# Patient Record
Sex: Male | Born: 1985 | Race: White | Hispanic: No | Marital: Single | Smoking: Current every day smoker
Health system: Southern US, Community
[De-identification: ages and names within clinical notes are randomized; demographics above are authoritative.]

## PROBLEM LIST (undated history)

## (undated) DIAGNOSIS — F1410 Cocaine abuse, uncomplicated: Secondary | ICD-10-CM

## (undated) DIAGNOSIS — S064XAA Epidural hemorrhage with loss of consciousness status unknown, initial encounter: Secondary | ICD-10-CM

## (undated) DIAGNOSIS — R569 Unspecified convulsions: Secondary | ICD-10-CM

## (undated) DIAGNOSIS — S064X9A Epidural hemorrhage with loss of consciousness of unspecified duration, initial encounter: Secondary | ICD-10-CM

## (undated) DIAGNOSIS — F1110 Opioid abuse, uncomplicated: Secondary | ICD-10-CM

## (undated) DIAGNOSIS — F1010 Alcohol abuse, uncomplicated: Secondary | ICD-10-CM

## (undated) HISTORY — PX: DENTAL SURGERY: SHX609

---

## 2010-10-12 ENCOUNTER — Emergency Department (HOSPITAL_BASED_OUTPATIENT_CLINIC_OR_DEPARTMENT_OTHER)
Admission: EM | Admit: 2010-10-12 | Discharge: 2010-10-12 | Payer: Self-pay | Source: Home / Self Care | Admitting: Emergency Medicine

## 2010-10-15 LAB — URINALYSIS, ROUTINE W REFLEX MICROSCOPIC
Bilirubin Urine: NEGATIVE
Ketones, ur: NEGATIVE mg/dL
Protein, ur: NEGATIVE mg/dL
Urine Glucose, Fasting: NEGATIVE mg/dL

## 2010-10-15 LAB — LACTIC ACID, PLASMA: Lactic Acid, Venous: 1.6 mmol/L (ref 0.5–2.2)

## 2010-10-15 LAB — COMPREHENSIVE METABOLIC PANEL
ALT: 18 U/L (ref 0–53)
Albumin: 5 g/dL (ref 3.5–5.2)
Alkaline Phosphatase: 125 U/L — ABNORMAL HIGH (ref 39–117)
GFR calc Af Amer: >60 mL/min (ref >60)
Potassium: 3.7 mEq/L (ref 3.5–5.1)
Sodium: 145 mEq/L (ref 135–145)
Total Protein: 8.1 g/dL (ref 6.0–8.3)

## 2010-10-15 LAB — DIFFERENTIAL
Basophils Relative: 0 % (ref 0–1)
Eosinophils Absolute: 0.1 10*3/uL (ref 0.0–0.7)
Monocytes Absolute: 0.6 10*3/uL (ref 0.1–1.0)
Monocytes Relative: 5 % (ref 3–12)
Neutro Abs: 9.4 10*3/uL — ABNORMAL HIGH (ref 1.7–7.7)

## 2010-10-15 LAB — POCT TOXICOLOGY PANEL
Cocaine: POSITIVE
Tetrahydrocannabinol: POSITIVE

## 2010-10-15 LAB — CBC
Hemoglobin: 16.1 g/dL (ref 13.0–17.0)
MCH: 30.3 pg (ref 26.0–34.0)
MCHC: 35.7 g/dL (ref 30.0–36.0)

## 2011-01-27 ENCOUNTER — Emergency Department (HOSPITAL_COMMUNITY): Payer: No Typology Code available for payment source

## 2011-01-27 ENCOUNTER — Emergency Department (HOSPITAL_COMMUNITY)
Admission: EM | Admit: 2011-01-27 | Discharge: 2011-01-27 | Disposition: A | Discharge status: Home / Self Care | Payer: No Typology Code available for payment source | Attending: Emergency Medicine | Admitting: Emergency Medicine

## 2011-01-27 DIAGNOSIS — S301XXA Contusion of abdominal wall, initial encounter: Secondary | ICD-10-CM | POA: Insufficient documentation

## 2011-01-27 DIAGNOSIS — R109 Unspecified abdominal pain: Secondary | ICD-10-CM | POA: Insufficient documentation

## 2011-01-27 DIAGNOSIS — IMO0002 Reserved for concepts with insufficient information to code with codable children: Secondary | ICD-10-CM | POA: Insufficient documentation

## 2011-01-27 LAB — COMPREHENSIVE METABOLIC PANEL
AST: 22 U/L (ref 0–37)
Albumin: 4.4 g/dL (ref 3.5–5.2)
Alkaline Phosphatase: 120 U/L — ABNORMAL HIGH (ref 39–117)
Chloride: 103 mEq/L (ref 96–112)
Creatinine, Ser: 0.67 mg/dL (ref 0.4–1.5)
GFR calc Af Amer: >60 mL/min (ref >60)
Potassium: 3.4 mEq/L — ABNORMAL LOW (ref 3.5–5.1)
Total Bilirubin: 0.3 mg/dL (ref 0.3–1.2)
Total Protein: 7.2 g/dL (ref 6.0–8.3)

## 2011-01-27 LAB — CBC
HCT: 43.1 % (ref 39.0–52.0)
Hemoglobin: 15.3 g/dL (ref 13.0–17.0)
MCHC: 35.5 g/dL (ref 30.0–36.0)
MCV: 87.2 fL (ref 78.0–100.0)
RDW: 13.5 % (ref 11.5–15.5)

## 2011-01-27 LAB — URINALYSIS, ROUTINE W REFLEX MICROSCOPIC
Glucose, UA: NEGATIVE mg/dL
Ketones, ur: NEGATIVE mg/dL
Nitrite: NEGATIVE
Specific Gravity, Urine: 1.012 (ref 1.005–1.030)
pH: 6 (ref 5.0–8.0)

## 2011-01-27 LAB — POCT I-STAT, CHEM 8
BUN: 4 mg/dL — ABNORMAL LOW (ref 6–23)
Calcium, Ion: 1.01 mmol/L — ABNORMAL LOW (ref 1.12–1.32)
Chloride: 105 mEq/L (ref 96–112)
Creatinine, Ser: 1.5 mg/dL (ref 0.4–1.5)
Glucose, Bld: 88 mg/dL (ref 70–99)

## 2011-01-27 LAB — ABO/RH: ABO/RH(D): A POS

## 2011-01-27 LAB — ETHANOL: Alcohol, Ethyl (B): 255 mg/dL — ABNORMAL HIGH (ref 0–10)

## 2011-01-27 LAB — TYPE AND SCREEN

## 2011-01-27 MED ORDER — IOHEXOL 300 MG/ML  SOLN
100.0000 mL | Freq: Once | INTRAMUSCULAR | Status: AC | PRN
  Administered 2011-01-27: 100 mL via INTRAVENOUS

## 2013-02-18 ENCOUNTER — Emergency Department (HOSPITAL_COMMUNITY): Payer: Self-pay

## 2013-02-18 ENCOUNTER — Emergency Department (HOSPITAL_COMMUNITY)
Admission: EM | Admit: 2013-02-18 | Discharge: 2013-02-19 | Disposition: A | Discharge status: Home / Self Care | Payer: Self-pay | Attending: Emergency Medicine | Admitting: Emergency Medicine

## 2013-02-18 ENCOUNTER — Encounter (HOSPITAL_COMMUNITY): Payer: Self-pay

## 2013-02-18 DIAGNOSIS — R300 Dysuria: Secondary | ICD-10-CM | POA: Insufficient documentation

## 2013-02-18 DIAGNOSIS — R109 Unspecified abdominal pain: Secondary | ICD-10-CM | POA: Insufficient documentation

## 2013-02-18 DIAGNOSIS — F1410 Cocaine abuse, uncomplicated: Secondary | ICD-10-CM | POA: Insufficient documentation

## 2013-02-18 DIAGNOSIS — F1010 Alcohol abuse, uncomplicated: Secondary | ICD-10-CM | POA: Insufficient documentation

## 2013-02-18 DIAGNOSIS — F1210 Cannabis abuse, uncomplicated: Secondary | ICD-10-CM | POA: Insufficient documentation

## 2013-02-18 DIAGNOSIS — F1910 Other psychoactive substance abuse, uncomplicated: Secondary | ICD-10-CM

## 2013-02-18 DIAGNOSIS — Z79899 Other long term (current) drug therapy: Secondary | ICD-10-CM | POA: Insufficient documentation

## 2013-02-18 DIAGNOSIS — G40909 Epilepsy, unspecified, not intractable, without status epilepticus: Secondary | ICD-10-CM | POA: Insufficient documentation

## 2013-02-18 HISTORY — DX: Unspecified convulsions: R56.9

## 2013-02-18 LAB — COMPREHENSIVE METABOLIC PANEL
ALT: 48 U/L (ref 0–53)
AST: 64 U/L — ABNORMAL HIGH (ref 0–37)
Alkaline Phosphatase: 118 U/L — ABNORMAL HIGH (ref 39–117)
CO2: 29 mEq/L (ref 19–32)
Chloride: 101 mEq/L (ref 96–112)
GFR calc non Af Amer: >90 mL/min (ref >90)
Sodium: 144 mEq/L (ref 135–145)
Total Bilirubin: 0.2 mg/dL — ABNORMAL LOW (ref 0.3–1.2)

## 2013-02-18 LAB — CBC
Platelets: 346 10*3/uL (ref 150–400)
RBC: 4.58 MIL/uL (ref 4.22–5.81)
WBC: 5.5 10*3/uL (ref 4.0–10.5)

## 2013-02-18 LAB — RAPID URINE DRUG SCREEN, HOSP PERFORMED
Barbiturates: NOT DETECTED
Tetrahydrocannabinol: POSITIVE — AB

## 2013-02-18 LAB — VALPROIC ACID LEVEL: Valproic Acid Lvl: 10 ug/mL — ABNORMAL LOW (ref 50.0–100.0)

## 2013-02-18 MED ORDER — VITAMIN B-1 100 MG PO TABS
100.0000 mg | ORAL_TABLET | Freq: Every day | ORAL | Status: DC
  Administered 2013-02-19: 100 mg via ORAL
  Filled 2013-02-18: qty 1

## 2013-02-18 MED ORDER — THIAMINE HCL 100 MG/ML IJ SOLN: 100.0000 mg | Freq: Every day | INTRAMUSCULAR | Status: DC

## 2013-02-18 MED ORDER — LORAZEPAM 2 MG/ML IJ SOLN: 1.0000 mg | Freq: Four times a day (QID) | INTRAMUSCULAR | Status: DC | PRN

## 2013-02-18 MED ORDER — FOLIC ACID 1 MG PO TABS
1.0000 mg | ORAL_TABLET | Freq: Every day | ORAL | Status: DC
  Administered 2013-02-19: 1 mg via ORAL
  Filled 2013-02-18: qty 1

## 2013-02-18 MED ORDER — LORAZEPAM 1 MG PO TABS: 0.0000 mg | ORAL_TABLET | Freq: Two times a day (BID) | ORAL | Status: DC

## 2013-02-18 MED ORDER — DIVALPROEX SODIUM 500 MG PO DR TAB
500.0000 mg | DELAYED_RELEASE_TABLET | Freq: Two times a day (BID) | ORAL | Status: DC
  Administered 2013-02-19 (×2): 500 mg via ORAL
  Filled 2013-02-18 (×2): qty 1

## 2013-02-18 MED ORDER — IOHEXOL 300 MG/ML  SOLN
100.0000 mL | Freq: Once | INTRAMUSCULAR | Status: AC | PRN
  Administered 2013-02-18: 100 mL via INTRAVENOUS

## 2013-02-18 MED ORDER — LORAZEPAM 1 MG PO TABS
1.0000 mg | ORAL_TABLET | Freq: Four times a day (QID) | ORAL | Status: DC | PRN
  Filled 2013-02-18: qty 2

## 2013-02-18 MED ORDER — IOHEXOL 300 MG/ML  SOLN
50.0000 mL | Freq: Once | INTRAMUSCULAR | Status: AC | PRN
  Administered 2013-02-18: 50 mL via ORAL

## 2013-02-18 MED ORDER — LORAZEPAM 1 MG PO TABS
0.0000 mg | ORAL_TABLET | Freq: Four times a day (QID) | ORAL | Status: DC
  Administered 2013-02-19 (×2): 2 mg via ORAL
  Administered 2013-02-19: 1 mg via ORAL
  Filled 2013-02-18: qty 2
  Filled 2013-02-18: qty 1

## 2013-02-18 MED ORDER — ADULT MULTIVITAMIN W/MINERALS CH
1.0000 | ORAL_TABLET | Freq: Every day | ORAL | Status: DC
  Administered 2013-02-19: 1 via ORAL

## 2013-02-18 NOTE — ED Notes (Signed)
Pt states that his last seizure about two weeks ago

## 2013-02-18 NOTE — ED Notes (Signed)
CT aware that patient is finished with contrast.  

## 2013-02-18 NOTE — ED Provider Notes (Signed)
History  This chart was scribed for non-physician practitioner working with Jason Baker, MD by Jason Long, ED scribe. This patient was seen in room WTR6/WTR6 and the patient's care was started at 7:35 PM.   CSN: [id number]  Arrival date & time 02/18/13  1930    No chief complaint on file.   The history is provided by the patient. No language interpreter was used.    HPI Comments: Jason Long is a 27 y.o. male who presents to the Emergency Department stating he would like to be in detox from alcohol, cocaine, and marijuana. He states his last use was this afternoon. Pt states he was sober for about 18 months until mid February. He states he has been "partying" a lot recently. Pt states he has abdominal pain and dysuria. He states he has had 3 seizures in the last 6 weeks. Pt states he had a sleep deprivation EEG a few weeks ago and everything came back normal. Pt denies fever, neck pain, sore throat, CP, cough, SOB, nausea, emesis, diarrhea, HA, weakness, numbness and rash as associated symptoms.    No past medical history on file.  No past surgical history on file.  No family history on file.  History  Substance Use Topics  . Smoking status: Not on file  . Smokeless tobacco: Not on file  . Alcohol Use: Not on file      Review of Systems  A complete 10 system review of systems was obtained and all systems are negative except as noted in the HPI and PMH.   Allergies  Review of patient's allergies indicates no known allergies.  Home Medications   Current Outpatient Rx  Name  Route  Sig  Dispense  Refill  . divalproex (DEPAKOTE) 250 MG DR tablet   Oral   Take 500 mg by mouth 2 (two) times daily.           BP 146/81  Pulse 110  Temp(Src) 98.6 F (37 C) (Oral)  Resp 16  SpO2 94%  Physical Exam  Nursing note and vitals reviewed. Constitutional: He is oriented to person, place, and time. He appears well-developed and well-nourished. No distress.  HENT:   Head: Normocephalic and atraumatic.  Eyes: EOM are normal.  Neck: Neck supple. No tracheal deviation present.  Cardiovascular: Normal rate, regular rhythm and normal heart sounds.  Exam reveals no gallop and no friction rub.   No murmur heard. Pulmonary/Chest: Effort normal and breath sounds normal. No respiratory distress. He has no wheezes. He has no rales. He exhibits no tenderness.  Abdominal: He exhibits no distension and no mass. There is tenderness. There is no rebound and no guarding.  Diffuse abdominal tenderness, without rebounding or guarding. No focal tenderness. No fluid wave. No signs of acute or surgical abdomen.   Musculoskeletal: Normal range of motion.  Neurological: He is alert and oriented to person, place, and time.  Skin: Skin is warm and dry.  Psychiatric: He has a normal mood and affect. His behavior is normal.    ED Course  Procedures (including critical care time)  DIAGNOSTIC STUDIES: Oxygen Saturation is 94% on RA, adequate by my interpretation.    COORDINATION OF CARE: 9:10 PM-Discussed treatment plan with pt at bedside and pt agreed to plan.   Labs Reviewed  COMPREHENSIVE METABOLIC PANEL - Abnormal; Notable for the following:    Glucose, Bld 123 (*)    AST 64 (*)    Alkaline Phosphatase 118 (*)  Total Bilirubin 0.2 (*)    All other components within normal limits  ETHANOL - Abnormal; Notable for the following:    Alcohol, Ethyl (B) 323 (*)    All other components within normal limits  URINE RAPID DRUG SCREEN (HOSP PERFORMED) - Abnormal; Notable for the following:    Cocaine POSITIVE (*)    Tetrahydrocannabinol POSITIVE (*)    All other components within normal limits  CBC   No results found.   No diagnosis found.    MDM  Patient requesting detox.  He does have some abdominal tenderness.  Will check labs and ct.  Labs and imaging are normal.  Urine is still pending, otherwise medically clear.  Will move to the back and order psych  hold.  Discussed with ACT.      I personally performed the services described in this documentation, which was scribed in my presence. The recorded information has been reviewed and is accurate.    Jason Horseman, PA-C 02/19/13 [phone]

## 2013-02-18 NOTE — ED Notes (Signed)
Pt. And belongings searched and wanded by security. Pt. Placed in new blue scrubs.

## 2013-02-18 NOTE — ED Notes (Signed)
Pt here for detox from etoh, cocaine, and marijuana. Here voluntarily

## 2013-02-19 LAB — URINALYSIS, ROUTINE W REFLEX MICROSCOPIC
Hgb urine dipstick: NEGATIVE
Leukocytes, UA: NEGATIVE
Protein, ur: NEGATIVE mg/dL
Urobilinogen, UA: 0.2 mg/dL (ref 0.0–1.0)

## 2013-02-19 MED ORDER — NICOTINE 21 MG/24HR TD PT24
21.0000 mg | MEDICATED_PATCH | Freq: Every day | TRANSDERMAL | Status: DC
  Administered 2013-02-19: 21 mg via TRANSDERMAL
  Filled 2013-02-19: qty 1

## 2013-02-19 NOTE — Progress Notes (Signed)
[facility] has seen patient and provided him with a OC application. ° °

## 2013-02-19 NOTE — ED Provider Notes (Addendum)
Filed Vitals:   02/19/13 0512  BP: 139/79  Pulse: 95  Temp: 98.5 F (36.9 C)  Resp: 20   Sleeping this am.  Awaiting placement.  Celene Kras, MD 02/19/13 0745  10:36 AM Pt decided he did not want treatment.  Celene Kras, MD 02/19/13 [phone]

## 2013-02-19 NOTE — ED Notes (Signed)
Pt's CIWA is a 9. Pt did not received his Ativan at 0430 this morning and has a history of seizures. Last one was a week ago. Pt given 2 mg of Ativan and waited awhile. Tremors can still be seen with arms extended. Given the additional 2 mg to help pt with his detox.

## 2013-02-19 NOTE — ED Notes (Signed)
Pt transferred from triage, presents for detox from alcohol, cocaine & marijuana.  Pt reports he drinks a fifth/day.  States he is diag. With Bipolar, PTSD and Insomnia.  Pt irritable but cooperative. Smell of alcohol noted.

## 2013-02-19 NOTE — BH Assessment (Signed)
Assessment Note   Jason Long is an 27 y.o. male. Pt requests detox from alcohol .  Reports daily drinking of fifth of liquor for past 2 1/2 months.  Also using marijuana and cocaine.Reports 16 months of sobriety ended 11/2012 and "I need to get back on track."  Pt does report withdrawal symptoms and does appear to be withdrawing by elevated pulse.  Pt also reports 3 alcohol related siezures in 2014 with the most recent being 3 weeks ago.   Pt reports he was on his way to AA meeting tonight, got dizzy, came to [facility] instead.  Multiple prior treatment programs, including 2 lengthy residential programs in 2013 and 2006-2008 [place]).  Pt reports depression but denies SI/HI/AV.  Axis I: alcohol dependence, marijuana dependence Axis II: Deferred Axis III:  Past Medical History  Diagnosis Date  . Seizure    Axis IV: relapse Axis V: 41-50 serious symptoms  Past Medical History:  Past Medical History  Diagnosis Date  . Seizure     History reviewed. No pertinent past surgical history.  Family History: History reviewed. No pertinent family history.  Social History:  reports that  drinks alcohol. His tobacco and drug histories are not on file.  Additional Social History:  Alcohol / Drug Use Pain Medications: pt denies Prescriptions: pt denies Over the Counter: pt denies History of alcohol / drug use?: Yes Longest period of sobriety (when/how long): 16 months-ended 11/2012 Negative Consequences of Use: Financial;Legal;Personal relationships;Work / School Withdrawal Symptoms: Seizures Onset of Seizures:  2014-3 alcohol withrawal siezures in 2014 Date of most recent seizure: 3 weeks ago Substance #1 Name of Substance 1: alcohol 1 - Age of First Use: 12 1 - Amount (size/oz): fifth vodka 1 - Frequency: daily 1 - Duration: 2 1/2 months 1 - Last Use / Amount: 5/29 3/4 fifth Substance #2 Name of Substance 2: marijuana 2 - Age of First Use: 12 2 - Amount (size/oz): 1 gram 2 - Frequency:  5x week 2 - Duration: 1 month 2 - Last Use / Amount: 5/28, 1/2 blunt Substance #3 Name of Substance 3: cocaine 3 - Age of First Use: 17 3 - Amount (size/oz): 1 gram 3 - Frequency: 2x week 3 - Duration: 2 1/2 months 3 - Last Use / Amount: 5/26, 1 1/2 grams  CIWA: CIWA-Ar BP: 146/81 mmHg Pulse Rate: 110 Nausea and Vomiting: no nausea and no vomiting Tactile Disturbances: none Tremor: no tremor Auditory Disturbances: mild harshness or ability to frighten Paroxysmal Sweats: barely perceptible sweating, palms moist Visual Disturbances: mild sensitivity Anxiety: moderately anxious, or guarded, so anxiety is inferred Headache, Fullness in Head: moderate Agitation: normal activity Orientation and Clouding of Sensorium: oriented and can do serial additions CIWA-Ar Total: 12 COWS:    Allergies: No Known Allergies  Home Medications:  (Not in a hospital admission)  OB/GYN Status:  No LMP for male patient.  General Assessment Data Location of Assessment: [facility] ED ACT Assessment: Yes Living Arrangements: Parent Can pt return to current living arrangement?: Yes Admission Status: Voluntary Is patient capable of signing voluntary admission?: Yes Transfer from: Home Referral Source: Self/Family/Friend     Risk to self Suicidal Ideation: No Suicidal Intent: No Is patient at risk for suicide?: No Suicidal Plan?: No Access to Means: No What has been your use of drugs/alcohol within the last 12 months?: current use Previous Attempts/Gestures: No Intentional Self Injurious Behavior: None Family Suicide History: No Recent stressful life event(s): Other (Comment) (relapse) Persecutory voices/beliefs?: No Depression: Yes  Depression Symptoms: Despondent;Insomnia;Isolating;Fatigue;Guilt;Loss of interest in usual pleasures;Feeling worthless/self pity;Feeling angry/irritable Substance abuse history and/or treatment for substance abuse?: Yes Suicide prevention information given to  non-admitted patients: Not applicable  Risk to Others Homicidal Ideation: No Thoughts of Harm to Others: No Current Homicidal Intent: No Current Homicidal Plan: No Access to Homicidal Means: No History of harm to others?: No Assessment of Violence: None Noted Does patient have access to weapons?: No Criminal Charges Pending?: No Does patient have a court date: No  Psychosis Hallucinations: None noted Delusions: None noted  Mental Status Report Appear/Hygiene: Other (Comment) (casual) Eye Contact: Fair Motor Activity: Unremarkable Speech: Logical/coherent Level of Consciousness: Alert Mood: Other (Comment) (pleasant) Affect: Appropriate to circumstance Anxiety Level: Moderate Thought Processes: Coherent;Relevant Judgement: Unimpaired Orientation: Person;Place;Time;Situation Obsessive Compulsive Thoughts/Behaviors: None  Cognitive Functioning Concentration: Normal Memory: Recent Intact;Remote Intact IQ: Average Insight: Good Impulse Control: Fair Appetite: Poor Weight Loss: 10 Weight Gain: 0 Sleep: Decreased Total Hours of Sleep: 3 Vegetative Symptoms: None  ADLScreening [facility]) Patient's cognitive ability adequate to safely complete daily activities?: Yes Patient able to express need for assistance with ADLs?: Yes Independently performs ADLs?: Yes (appropriate for developmental age)  Abuse/Neglect [facility]) Physical Abuse: Denies Verbal Abuse: Denies Sexual Abuse: Denies  Prior Inpatient Therapy Prior Inpatient Therapy: Yes ([facility], [facility] in [place]-residential programs.) Prior Therapy Dates: 11/2012 Prior Therapy Facilty/Provider(s): [facility] x 2 Reason for Treatment: detox  Prior Outpatient Therapy Prior Outpatient Therapy: Yes Prior Therapy Dates: current Prior Therapy Facilty/Provider(s): [facility] Reason for Treatment: meds  ADL Screening (condition at time of admission) Patient's cognitive ability adequate to safely  complete daily activities?: Yes Patient able to express need for assistance with ADLs?: Yes Independently performs ADLs?: Yes (appropriate for developmental age) Weakness of Legs: None Weakness of Arms/Hands: None  Home Assistive Devices/Equipment Home Assistive Devices/Equipment: None    Abuse/Neglect Assessment (Assessment to be complete while patient is alone) Physical Abuse: Denies Verbal Abuse: Denies Sexual Abuse: Denies Exploitation of patient/patient's resources: Yes, present (Comment) Self-Neglect: Denies Values / Beliefs Cultural Requests During Hospitalization: None Spiritual Requests During Hospitalization: None   Advance Directives (For Healthcare) Advance Directive: Patient does not have advance directive;Patient would not like information    Additional Information 1:1 In Past 12 Months?: No CIRT Risk: No Elopement Risk: No Does patient have medical clearance?: Yes     Disposition:  Disposition Initial Assessment Completed for this Encounter: Yes Disposition of Patient: Inpatient treatment program Type of inpatient treatment program: Adult  On Site Evaluation by:   Reviewed with Physician:     Lorri Frederick 02/19/2013 12:07 AM

## 2013-02-21 NOTE — ED Provider Notes (Signed)
Medical screening examination/treatment/procedure(s) were performed by non-physician practitioner and as supervising physician I was immediately available for consultation/collaboration.  Sachit Gilman T Zakaria Fromer, MD 02/21/13 0955 

## 2013-10-11 ENCOUNTER — Emergency Department (HOSPITAL_COMMUNITY): Payer: Self-pay

## 2013-10-11 ENCOUNTER — Inpatient Hospital Stay (HOSPITAL_COMMUNITY)
Admission: EM | Admit: 2013-10-11 | Discharge: 2013-10-19 | DRG: 025 | Disposition: A | Discharge status: Home / Self Care | Payer: Self-pay | Attending: Neurosurgery | Admitting: Neurosurgery

## 2013-10-11 ENCOUNTER — Encounter (HOSPITAL_COMMUNITY): Payer: Self-pay | Admitting: Emergency Medicine

## 2013-10-11 DIAGNOSIS — G9340 Encephalopathy, unspecified: Secondary | ICD-10-CM | POA: Diagnosis present

## 2013-10-11 DIAGNOSIS — S064XAA Epidural hemorrhage with loss of consciousness status unknown, initial encounter: Secondary | ICD-10-CM | POA: Diagnosis present

## 2013-10-11 DIAGNOSIS — S064X9A Epidural hemorrhage with loss of consciousness of unspecified duration, initial encounter: Secondary | ICD-10-CM | POA: Diagnosis present

## 2013-10-11 DIAGNOSIS — R404 Transient alteration of awareness: Secondary | ICD-10-CM | POA: Diagnosis not present

## 2013-10-11 DIAGNOSIS — IMO0002 Reserved for concepts with insufficient information to code with codable children: Secondary | ICD-10-CM | POA: Diagnosis not present

## 2013-10-11 DIAGNOSIS — D649 Anemia, unspecified: Secondary | ICD-10-CM | POA: Diagnosis present

## 2013-10-11 DIAGNOSIS — H5500 Unspecified nystagmus: Secondary | ICD-10-CM | POA: Diagnosis present

## 2013-10-11 DIAGNOSIS — S020XXA Fracture of vault of skull, initial encounter for closed fracture: Secondary | ICD-10-CM

## 2013-10-11 DIAGNOSIS — I10 Essential (primary) hypertension: Secondary | ICD-10-CM | POA: Diagnosis present

## 2013-10-11 DIAGNOSIS — S065XAA Traumatic subdural hemorrhage with loss of consciousness status unknown, initial encounter: Principal | ICD-10-CM | POA: Diagnosis present

## 2013-10-11 DIAGNOSIS — R000 Tachycardia, unspecified: Secondary | ICD-10-CM | POA: Diagnosis present

## 2013-10-11 DIAGNOSIS — Z9119 Patient's noncompliance with other medical treatment and regimen: Secondary | ICD-10-CM

## 2013-10-11 DIAGNOSIS — Z79899 Other long term (current) drug therapy: Secondary | ICD-10-CM

## 2013-10-11 DIAGNOSIS — S06309A Unspecified focal traumatic brain injury with loss of consciousness of unspecified duration, initial encounter: Principal | ICD-10-CM

## 2013-10-11 DIAGNOSIS — F10939 Alcohol use, unspecified with withdrawal, unspecified: Secondary | ICD-10-CM | POA: Diagnosis present

## 2013-10-11 DIAGNOSIS — F17200 Nicotine dependence, unspecified, uncomplicated: Secondary | ICD-10-CM | POA: Diagnosis present

## 2013-10-11 DIAGNOSIS — R7309 Other abnormal glucose: Secondary | ICD-10-CM | POA: Diagnosis present

## 2013-10-11 DIAGNOSIS — D72829 Elevated white blood cell count, unspecified: Secondary | ICD-10-CM | POA: Diagnosis present

## 2013-10-11 DIAGNOSIS — R739 Hyperglycemia, unspecified: Secondary | ICD-10-CM | POA: Diagnosis present

## 2013-10-11 DIAGNOSIS — F10239 Alcohol dependence with withdrawal, unspecified: Secondary | ICD-10-CM

## 2013-10-11 DIAGNOSIS — G40909 Epilepsy, unspecified, not intractable, without status epilepticus: Secondary | ICD-10-CM | POA: Diagnosis present

## 2013-10-11 DIAGNOSIS — F1020 Alcohol dependence, uncomplicated: Secondary | ICD-10-CM | POA: Diagnosis present

## 2013-10-11 DIAGNOSIS — J9600 Acute respiratory failure, unspecified whether with hypoxia or hypercapnia: Secondary | ICD-10-CM | POA: Diagnosis present

## 2013-10-11 DIAGNOSIS — F1410 Cocaine abuse, uncomplicated: Secondary | ICD-10-CM | POA: Diagnosis present

## 2013-10-11 DIAGNOSIS — W1789XA Other fall from one level to another, initial encounter: Secondary | ICD-10-CM | POA: Diagnosis present

## 2013-10-11 DIAGNOSIS — F10931 Alcohol use, unspecified with withdrawal delirium: Secondary | ICD-10-CM | POA: Diagnosis present

## 2013-10-11 DIAGNOSIS — S065X9A Traumatic subdural hemorrhage with loss of consciousness of unspecified duration, initial encounter: Secondary | ICD-10-CM | POA: Diagnosis present

## 2013-10-11 DIAGNOSIS — I498 Other specified cardiac arrhythmias: Secondary | ICD-10-CM | POA: Diagnosis not present

## 2013-10-11 DIAGNOSIS — F10231 Alcohol dependence with withdrawal delirium: Secondary | ICD-10-CM

## 2013-10-11 DIAGNOSIS — S02109A Fracture of base of skull, unspecified side, initial encounter for closed fracture: Principal | ICD-10-CM | POA: Diagnosis present

## 2013-10-11 DIAGNOSIS — Z91199 Patient's noncompliance with other medical treatment and regimen due to unspecified reason: Secondary | ICD-10-CM

## 2013-10-11 DIAGNOSIS — F1210 Cannabis abuse, uncomplicated: Secondary | ICD-10-CM | POA: Diagnosis present

## 2013-10-11 DIAGNOSIS — M6282 Rhabdomyolysis: Secondary | ICD-10-CM | POA: Diagnosis not present

## 2013-10-11 DIAGNOSIS — R1310 Dysphagia, unspecified: Secondary | ICD-10-CM | POA: Diagnosis not present

## 2013-10-11 HISTORY — DX: Epidural hemorrhage with loss of consciousness status unknown, initial encounter: S06.4XAA

## 2013-10-11 HISTORY — DX: Epidural hemorrhage with loss of consciousness of unspecified duration, initial encounter: S06.4X9A

## 2013-10-11 LAB — BASIC METABOLIC PANEL
BUN: 9 mg/dL (ref 6–23)
CALCIUM: 10.3 mg/dL (ref 8.4–10.5)
CO2: 26 mEq/L (ref 19–32)
Chloride: 93 mEq/L — ABNORMAL LOW (ref 96–112)
Creatinine, Ser: 0.75 mg/dL (ref 0.50–1.35)
GLUCOSE: 119 mg/dL — AB (ref 70–99)
POTASSIUM: 4.5 meq/L (ref 3.7–5.3)
SODIUM: 137 meq/L (ref 137–147)

## 2013-10-11 LAB — MRSA PCR SCREENING: MRSA by PCR: POSITIVE — AB

## 2013-10-11 LAB — CBC
HCT: 44.5 % (ref 39.0–52.0)
HEMOGLOBIN: 16.4 g/dL (ref 13.0–17.0)
MCH: 34.4 pg — AB (ref 26.0–34.0)
MCHC: 36.9 g/dL — AB (ref 30.0–36.0)
MCV: 93.3 fL (ref 78.0–100.0)
PLATELETS: 226 10*3/uL (ref 150–400)
RBC: 4.77 MIL/uL (ref 4.22–5.81)
RDW: 13.4 % (ref 11.5–15.5)
WBC: 11.4 10*3/uL — ABNORMAL HIGH (ref 4.0–10.5)

## 2013-10-11 LAB — GLUCOSE, CAPILLARY: GLUCOSE-CAPILLARY: 179 mg/dL — AB (ref 70–99)

## 2013-10-11 LAB — RAPID URINE DRUG SCREEN, HOSP PERFORMED
Amphetamines: NOT DETECTED
Barbiturates: NOT DETECTED
Benzodiazepines: POSITIVE — AB
COCAINE: POSITIVE — AB
OPIATES: POSITIVE — AB
TETRAHYDROCANNABINOL: POSITIVE — AB

## 2013-10-11 LAB — ETHANOL: Alcohol, Ethyl (B): <11 mg/dL (ref 0–11)

## 2013-10-11 MED ORDER — HYDROMORPHONE HCL PF 1 MG/ML IJ SOLN
0.5000 mg | INTRAMUSCULAR | Status: DC | PRN
  Administered 2013-10-11 – 2013-10-12 (×4): 0.5 mg via INTRAVENOUS
  Filled 2013-10-11 (×5): qty 1

## 2013-10-11 MED ORDER — SODIUM CHLORIDE 0.9 % IV SOLN
20.0000 mg/kg | Freq: Once | INTRAVENOUS | Status: DC
  Filled 2013-10-11: qty 25.9

## 2013-10-11 MED ORDER — ACETAMINOPHEN 325 MG PO TABS: 650.0000 mg | ORAL_TABLET | ORAL | Status: DC | PRN

## 2013-10-11 MED ORDER — SODIUM CHLORIDE 0.9 % IV SOLN
1300.0000 mg | Freq: Once | INTRAVENOUS | Status: AC
  Administered 2013-10-11: 1300 mg via INTRAVENOUS
  Filled 2013-10-11: qty 26

## 2013-10-11 MED ORDER — PHENYTOIN SODIUM 50 MG/ML IJ SOLN
100.0000 mg | Freq: Three times a day (TID) | INTRAMUSCULAR | Status: DC
  Administered 2013-10-12 – 2013-10-19 (×21): 100 mg via INTRAVENOUS
  Filled 2013-10-11 (×25): qty 2

## 2013-10-11 MED ORDER — PANTOPRAZOLE SODIUM 40 MG IV SOLR
40.0000 mg | Freq: Every day | INTRAVENOUS | Status: DC
  Administered 2013-10-11 – 2013-10-14 (×4): 40 mg via INTRAVENOUS
  Filled 2013-10-11 (×6): qty 40

## 2013-10-11 MED ORDER — ACETAMINOPHEN 650 MG RE SUPP: 650.0000 mg | RECTAL | Status: DC | PRN

## 2013-10-11 MED ORDER — CHLORHEXIDINE GLUCONATE CLOTH 2 % EX PADS
6.0000 | MEDICATED_PAD | Freq: Every day | CUTANEOUS | Status: AC
  Administered 2013-10-13 – 2013-10-16 (×4): 6 via TOPICAL

## 2013-10-11 MED ORDER — LORAZEPAM 2 MG/ML IJ SOLN
0.5000 mg | Freq: Once | INTRAMUSCULAR | Status: AC
  Administered 2013-10-11: 0.5 mg via INTRAMUSCULAR
  Filled 2013-10-11: qty 1

## 2013-10-11 MED ORDER — ACETAMINOPHEN-CODEINE #3 300-30 MG PO TABS
1.0000 | ORAL_TABLET | ORAL | Status: DC | PRN
  Administered 2013-10-11 (×2): 2 via ORAL
  Filled 2013-10-11 (×2): qty 2

## 2013-10-11 MED ORDER — SODIUM CHLORIDE 0.9 % IV SOLN
INTRAVENOUS | Status: DC
  Administered 2013-10-11: 19:00:00 via INTRAVENOUS

## 2013-10-11 MED ORDER — LABETALOL HCL 5 MG/ML IV SOLN: 10.0000 mg | INTRAVENOUS | Status: DC | PRN

## 2013-10-11 MED ORDER — MUPIROCIN 2 % EX OINT
1.0000 "application " | TOPICAL_OINTMENT | Freq: Two times a day (BID) | CUTANEOUS | Status: AC
  Administered 2013-10-11 – 2013-10-16 (×10): 1 via NASAL
  Filled 2013-10-11: qty 22

## 2013-10-11 MED ORDER — ONDANSETRON HCL 4 MG/2ML IJ SOLN
4.0000 mg | Freq: Four times a day (QID) | INTRAMUSCULAR | Status: DC | PRN
  Administered 2013-10-12: 4 mg via INTRAVENOUS
  Filled 2013-10-11: qty 2

## 2013-10-11 NOTE — ED Notes (Signed)
Patient transported to CT 

## 2013-10-11 NOTE — H&P (Signed)
Jason Long is an 28 y.o. male.   Chief Complaint: Headache HPI: 28 year old male with a history of a generalized seizure disorder who has been noncompliant with his valproic acid suffered an acute generalized seizure and subsequent fall striking his head onto concrete. Patient awakened shortly after the seizure. He had a brief post ictal interlude but returned to full consciousness without obvious deficit. No evidence of hemodynamic instability or hypoxia. Patient transferred to [facility] for evaluation. Workup in the emergency department demonstrates a left parietal extra-axial hematoma either a posterior epidural hemorrhage or a loculated subdural hemorrhage. The hemorrhage is causing some mild local mass effect but no evidence of herniation or obvious increased intracranial pressure. The patient had no further seizures. He notes headaches but otherwise states he feels good.  Previous workup for generalized seizure without obvious cause by patient and his father's report. Workup has included previous CT scans and an MRI scan done at another hospital which is unavailable for my review.  Past Medical History  Diagnosis Date  . Seizure     Past Surgical History  Procedure Laterality Date  . Dental surgery      History reviewed. No pertinent family history. Social History:  reports that he has been smoking Cigarettes.  He has been smoking about 0.50 packs per day. He has never used smokeless tobacco. He reports that he drinks alcohol. He reports that he uses illicit drugs (Marijuana).  Allergies: No Known Allergies  Medications Prior to Admission  Medication Sig Dispense Refill  . ibuprofen (ADVIL,MOTRIN) 800 MG tablet Take 800 mg by mouth every 8 (eight) hours as needed for moderate pain.      . Valproic Acid 250 MG CPDR Take 500 capsules by mouth 2 (two) times daily.        Results for orders placed during the hospital encounter of 10/11/13 (from the past 48 hour(s))  GLUCOSE,  CAPILLARY     Status: Abnormal   Collection Time    10/11/13 12:18 PM      Result Value Range   Glucose-Capillary 179 (*) 70 - 99 mg/dL   Comment 1 Notify RN    CBC     Status: Abnormal   Collection Time    10/11/13  1:30 PM      Result Value Range   WBC 11.4 (*) 4.0 - 10.5 K/uL   RBC 4.77  4.22 - 5.81 MIL/uL   Hemoglobin 16.4  13.0 - 17.0 g/dL   HCT 44.5  39.0 - 52.0 %   MCV 93.3  78.0 - 100.0 fL   MCH 34.4 (*) 26.0 - 34.0 pg   MCHC 36.9 (*) 30.0 - 36.0 g/dL   RDW 13.4  11.5 - 15.5 %   Platelets 226  150 - 400 K/uL  BASIC METABOLIC PANEL     Status: Abnormal   Collection Time    10/11/13  1:30 PM      Result Value Range   Sodium 137  137 - 147 mEq/L   Potassium 4.5  3.7 - 5.3 mEq/L   Chloride 93 (*) 96 - 112 mEq/L   CO2 26  19 - 32 mEq/L   Glucose, Bld 119 (*) 70 - 99 mg/dL   BUN 9  6 - 23 mg/dL   Creatinine, Ser 0.75  0.50 - 1.35 mg/dL   Calcium 10.3  8.4 - 10.5 mg/dL   GFR calc non Af Amer >90  >90 mL/min   GFR calc Af Amer >90  >90  mL/min   Comment: (NOTE)     The eGFR has been calculated using the CKD EPI equation.     This calculation has not been validated in all clinical situations.     eGFR's persistently <90 mL/min signify possible Chronic Kidney     Disease.  ETHANOL     Status: None   Collection Time    10/11/13  1:30 PM      Result Value Range   Alcohol, Ethyl (B) <11  0 - 11 mg/dL   Comment:            LOWEST DETECTABLE LIMIT FOR     SERUM ALCOHOL IS 11 mg/dL     FOR MEDICAL PURPOSES ONLY   Ct Head Wo Contrast  10/11/2013   CLINICAL DATA:  [place] off truck after a seizure.  Head trauma.  EXAM: CT HEAD WITHOUT CONTRAST  CT CERVICAL SPINE WITHOUT CONTRAST  TECHNIQUE: Multidetector CT imaging of the head and cervical spine was performed following the standard protocol without intravenous contrast. Multiplanar CT image reconstructions of the cervical spine were also generated.  COMPARISON:  01/27/2011.  FINDINGS: CT HEAD FINDINGS  There is a 14 mm thick  hyperdense biconvex extra-axial blood collection in the left parietal region consistent with an acute subdural or epidural hematoma. There is a linear subtle lucency in the left posterior calvarium on image 26, 27 and 28 series 4 which could represent a nondisplaced skull fracture although this is in the region of the lambdoid suture. There are bilateral scalp hematomas in the right parietal greater than left parieto-occipital regions. No midline shift. No parenchymal contusion. Sinuses and mastoids clear.  CT CERVICAL SPINE FINDINGS  There is no visible cervical spine fracture, traumatic subluxation, prevertebral soft tissue swelling, or intraspinal hematoma. Slight reversal of the normal cervical lordotic curve. No neck masses. Small air cyst anteriorly right lung apex without visible pneumothorax.  IMPRESSION: 14 mm thick biconvex extra-axial blood collection left parietal region consistent with acute subdural or epidural hematoma. Bilateral scalp hematomas. Equivocal/nondisplaced left parieto-occipital skull fracture.  Critical Value/emergent results were called by telephone at the time of interpretation on 10/11/2013 at 1:53 PM to Dr. Michele Mcalpine , who verbally acknowledged these results.  No visible cervical spine fracture.   Electronically Signed   By: Rolla Flatten M.D.   On: 10/11/2013 14:09   Ct Cervical Spine Wo Contrast  10/11/2013   CLINICAL DATA:  [place] off truck after a seizure.  Head trauma.  EXAM: CT HEAD WITHOUT CONTRAST  CT CERVICAL SPINE WITHOUT CONTRAST  TECHNIQUE: Multidetector CT imaging of the head and cervical spine was performed following the standard protocol without intravenous contrast. Multiplanar CT image reconstructions of the cervical spine were also generated.  COMPARISON:  01/27/2011.  FINDINGS: CT HEAD FINDINGS  There is a 14 mm thick hyperdense biconvex extra-axial blood collection in the left parietal region consistent with an acute subdural or epidural hematoma. There is a  linear subtle lucency in the left posterior calvarium on image 26, 27 and 28 series 4 which could represent a nondisplaced skull fracture although this is in the region of the lambdoid suture. There are bilateral scalp hematomas in the right parietal greater than left parieto-occipital regions. No midline shift. No parenchymal contusion. Sinuses and mastoids clear.  CT CERVICAL SPINE FINDINGS  There is no visible cervical spine fracture, traumatic subluxation, prevertebral soft tissue swelling, or intraspinal hematoma. Slight reversal of the normal cervical lordotic curve. No neck masses. Small air cyst  anteriorly right lung apex without visible pneumothorax.  IMPRESSION: 14 mm thick biconvex extra-axial blood collection left parietal region consistent with acute subdural or epidural hematoma. Bilateral scalp hematomas. Equivocal/nondisplaced left parieto-occipital skull fracture.  Critical Value/emergent results were called by telephone at the time of interpretation on 10/11/2013 at 1:53 PM to Dr. Michele Mcalpine , who verbally acknowledged these results.  No visible cervical spine fracture.   Electronically Signed   By: Rolla Flatten M.D.   On: 10/11/2013 14:09    Pertinent items are noted in HPI.  Blood pressure 134/95, pulse 112, temperature 98.2 F (36.8 C), temperature source Oral, resp. rate 30, height 6' (1.829 m), weight 64.8 kg (142 lb 13.7 oz), SpO2 100.00%.  He is awake and alert. He is somewhat restless but otherwise appropriate. His speech is fluent. He is oriented x4. Cranial nerve function is intact bilaterally. Pupils 3 mm brisk symmetrically reactive bilaterally. Motor examination 5/5 bilaterally without evidence of pronator drift. Sensory examination intact. Reflexes are normal. No evidence of cerebellar dysfunction. Examination head ears eyes nose throat demonstrates evidence of a right parietal subgaleal hematoma without laceration. Oropharynx nasopharynx and external auditory canals clear.  Neck supple without tenderness. Airway midline. Carotid pulses normal. Chest clear to auscultation. Heart regular rate and rhythm. Abdomen benign. Extremities free from injury or deformity. Assessment/Plan Generalized seizure with secondary closed head injury. Significant left extra-axial fluid collection consistent with epidural versus subdural hematoma. No evidence of fracture. No evidence of severe mass effect or increased intracranial pressure. Plan admission to ICU. Seizure control with Dilantin. Keep n.p.o. ICU observation. Followup head CT scan in morning.  Sherrey North A 10/11/2013, 4:04 PM

## 2013-10-11 NOTE — ED Notes (Signed)
Per EMS- patient had a witnessed seizure that lasted <1 minute. Bystanders reported that the patient was blue and they did chest compressions and 2 breaths prior to EMS arrival. Patient has a history of alcohol abuse and friend stated that he patient had been drinking this AM. Patient hit his head and has a hematoma on the back of his head.

## 2013-10-11 NOTE — ED Notes (Signed)
Report given to [facility]  

## 2013-10-11 NOTE — ED Provider Notes (Signed)
CSN: [id number]     Arrival date & time 10/11/13  1200 History   First MD Initiated Contact with Patient 10/11/13 1310     Chief Complaint  Patient presents with  . Seizures  . Head Injury   (Consider location/radiation/quality/duration/timing/severity/associated sxs/prior Treatment) HPI Comments: Patient is a 28 year old male with a past medical history of seizures who presents to the emergency department after having a witnessed seizure. Pt states he was at work, was on a truck a few feet high, fell off and hit the back of his head, lost consciousness for about 1 minute. Bystanders told EMS he then appeared blue, did chest compressions and two breaths prior to EMS arrival and he was then conscious. Friend told EMS pt had been drinking earlier this morning, patient himself states he had "a small amount of beer, less than a glass". Hx of alcohol abuse. Denies any other drug use. Currently complaining of neck pain and severe headache. States this is the worst headache of his lift. Admits to associated nausea. Denies confusion, fever, chills, sob, recent illness. He was diagnosed with seizures about 5 months ago, has had 4 seizures since, does not take valproic acid because it is too expensive.  Patient is a 28 y.o. male presenting with seizures and head injury. The history is provided by the patient and the EMS personnel.  Seizures Head Injury Associated symptoms: headache, nausea, neck pain and seizures     Past Medical History  Diagnosis Date  . Seizure    Past Surgical History  Procedure Laterality Date  . Dental surgery     History reviewed. No pertinent family history. History  Substance Use Topics  . Smoking status: Current Every Day Smoker -- 0.50 packs/day    Types: Cigarettes  . Smokeless tobacco: Never Used  . Alcohol Use: Yes     Comment: 12 pack a week    Review of Systems  Gastrointestinal: Positive for nausea.  Musculoskeletal: Positive for neck pain.  Neurological:  Positive for dizziness, seizures, syncope and headaches.  All other systems reviewed and are negative.    Allergies  Review of patient's allergies indicates no known allergies.  Home Medications   Current Outpatient Rx  Name  Route  Sig  Dispense  Refill  . ibuprofen (ADVIL,MOTRIN) 800 MG tablet   Oral   Take 800 mg by mouth every 8 (eight) hours as needed for moderate pain.         . Valproic Acid 250 MG CPDR   Oral   Take 500 capsules by mouth 2 (two) times daily.          BP 156/79  Pulse 112  Temp(Src) 98 F (36.7 C) (Oral)  Resp 20  SpO2 100% Physical Exam  Nursing note and vitals reviewed. Constitutional: He is oriented to person, place, and time. He appears well-developed and well-nourished. No distress. Cervical collar in place.  HENT:  Head: Normocephalic and atraumatic. Head is without raccoon's eyes and without Battle's sign.  Nose: Nose normal.  Mouth/Throat: Oropharynx is clear and moist.  Small hematomas bilateral sides of head. Tender all over head. No hemotympanum.  Eyes: Conjunctivae are normal. Left eye exhibits hordeolum.  Horizontal nystagmus. Severe headache with eye movement.  Cardiovascular: Regular rhythm, normal heart sounds and intact distal pulses.  Tachycardia present.   Pulmonary/Chest: Effort normal and breath sounds normal. He has no decreased breath sounds. He has no wheezes. He has no rhonchi.  Abdominal: Soft. Bowel sounds are normal. There is  no tenderness.  Musculoskeletal: Normal range of motion. He exhibits no edema.       Cervical back: He exhibits tenderness and bony tenderness.  Neurological: He is alert and oriented to person, place, and time. He has normal strength. No cranial nerve deficit or sensory deficit. He displays no seizure activity. Coordination normal. GCS eye subscore is 4. GCS verbal subscore is 5. GCS motor subscore is 6.  Skin: Skin is warm and dry. He is not diaphoretic.  Psychiatric: His behavior is normal.  His mood appears anxious.  Fidgety.    ED Course  Procedures (including critical care time) Labs Review Labs Reviewed  GLUCOSE, CAPILLARY - Abnormal; Notable for the following:    Glucose-Capillary 179 (*)    All other components within normal limits  CBC  BASIC METABOLIC PANEL   Imaging Review Ct Head Wo Contrast  10/11/2013   CLINICAL DATA:  Larey Seat off truck after a seizure.  Head trauma.  EXAM: CT HEAD WITHOUT CONTRAST  CT CERVICAL SPINE WITHOUT CONTRAST  TECHNIQUE: Multidetector CT imaging of the head and cervical spine was performed following the standard protocol without intravenous contrast. Multiplanar CT image reconstructions of the cervical spine were also generated.  COMPARISON:  01/27/2011.  FINDINGS: CT HEAD FINDINGS  There is a 14 mm thick hyperdense biconvex extra-axial blood collection in the left parietal region consistent with an acute subdural or epidural hematoma. There is a linear subtle lucency in the left posterior calvarium on image 26, 27 and 28 series 4 which could represent a nondisplaced skull fracture although this is in the region of the lambdoid suture. There are bilateral scalp hematomas in the right parietal greater than left parieto-occipital regions. No midline shift. No parenchymal contusion. Sinuses and mastoids clear.  CT CERVICAL SPINE FINDINGS  There is no visible cervical spine fracture, traumatic subluxation, prevertebral soft tissue swelling, or intraspinal hematoma. Slight reversal of the normal cervical lordotic curve. No neck masses. Small air cyst anteriorly right lung apex without visible pneumothorax.  IMPRESSION: 14 mm thick biconvex extra-axial blood collection left parietal region consistent with acute subdural or epidural hematoma. Bilateral scalp hematomas. Equivocal/nondisplaced left parieto-occipital skull fracture.  Critical Value/emergent results were called by telephone at the time of interpretation on 10/11/2013 at 1:53 PM to Dr. Johnnette Gourd ,  who verbally acknowledged these results.  No visible cervical spine fracture.   Electronically Signed   By: Davonna Belling M.D.   On: 10/11/2013 14:09   Ct Cervical Spine Wo Contrast  10/11/2013   CLINICAL DATA:  Larey Seat off truck after a seizure.  Head trauma.  EXAM: CT HEAD WITHOUT CONTRAST  CT CERVICAL SPINE WITHOUT CONTRAST  TECHNIQUE: Multidetector CT imaging of the head and cervical spine was performed following the standard protocol without intravenous contrast. Multiplanar CT image reconstructions of the cervical spine were also generated.  COMPARISON:  01/27/2011.  FINDINGS: CT HEAD FINDINGS  There is a 14 mm thick hyperdense biconvex extra-axial blood collection in the left parietal region consistent with an acute subdural or epidural hematoma. There is a linear subtle lucency in the left posterior calvarium on image 26, 27 and 28 series 4 which could represent a nondisplaced skull fracture although this is in the region of the lambdoid suture. There are bilateral scalp hematomas in the right parietal greater than left parieto-occipital regions. No midline shift. No parenchymal contusion. Sinuses and mastoids clear.  CT CERVICAL SPINE FINDINGS  There is no visible cervical spine fracture, traumatic subluxation, prevertebral soft tissue  swelling, or intraspinal hematoma. Slight reversal of the normal cervical lordotic curve. No neck masses. Small air cyst anteriorly right lung apex without visible pneumothorax.  IMPRESSION: 14 mm thick biconvex extra-axial blood collection left parietal region consistent with acute subdural or epidural hematoma. Bilateral scalp hematomas. Equivocal/nondisplaced left parieto-occipital skull fracture.  Critical Value/emergent results were called by telephone at the time of interpretation on 10/11/2013 at 1:53 PM to Dr. Johnnette GourdOBYN ALBERT , who verbally acknowledged these results.  No visible cervical spine fracture.   Electronically Signed   By: Davonna BellingJohn  Curnes M.D.   On: 10/11/2013  14:09    EKG Interpretation   None       MDM   1. Traumatic epidural hematoma   2. Acute subdural hematoma   3. Closed fracture of parietal bone of skull     Pt presenting after seizure, LOC. He is well appearing and in NAD, tachycardic, vitals otherwise stable. Drinking etoh earlier today prior to seizure, however pt denies a large amount. Has the worst headache of his life, severe pain with eye movements, nystagmus noted. Neuro exam otherwise normal. Does not take valproic acid due to cost. Labs pending- cbc, bmp, etoh, ua, uds. CT head/c-spine pending. 2:08 PM CT head showing 14 mm thick acute subdural or epidural hematoma with associated nondisplaced left parieto-occipital skull fracture. I spoke with Dr. Jordan LikesPool, neurosurgeon on call who will accept pt in [facility]. Pt stable at this time.  Case discussed with attending Dr. Blinda LeatherwoodPollina who also evaluated patient and agrees with plan of care.   Trevor MaceRobyn M Albert, PA-C 10/13/13 [phone]

## 2013-10-11 NOTE — Progress Notes (Signed)
~  1920: Pt c/o of severe 9/10 HA. Dilaudid given per order. Pt c/o of severe nausea. MD paged. Noted pt to be very anxious, fidgety, question pt about ETOH and drug use. CIWA scale completed/ documented at 26.  Attempt to assist in making pt comfortable. ~2049: MD paged again. Updated on pt's pain, unsuccessful use of medication, nausea, pt's presentation and CIWA scale of 26. Order for Zofran for nausea only. Updated pt. Will continue to monitor closely.

## 2013-10-11 NOTE — ED Notes (Signed)
Bed: WA18 Expected date:  Expected time:  Means of arrival:  Comments: EMS 

## 2013-10-12 ENCOUNTER — Encounter (HOSPITAL_COMMUNITY): Payer: Self-pay | Admitting: Anesthesiology

## 2013-10-12 ENCOUNTER — Inpatient Hospital Stay (HOSPITAL_COMMUNITY): Payer: Self-pay

## 2013-10-12 ENCOUNTER — Encounter (HOSPITAL_COMMUNITY)
Admission: EM | Disposition: A | Discharge status: Home / Self Care | Payer: Self-pay | Source: Home / Self Care | Attending: Neurosurgery

## 2013-10-12 ENCOUNTER — Inpatient Hospital Stay (HOSPITAL_COMMUNITY): Payer: Self-pay | Admitting: Anesthesiology

## 2013-10-12 DIAGNOSIS — R739 Hyperglycemia, unspecified: Secondary | ICD-10-CM | POA: Diagnosis present

## 2013-10-12 DIAGNOSIS — F10939 Alcohol use, unspecified with withdrawal, unspecified: Secondary | ICD-10-CM | POA: Diagnosis present

## 2013-10-12 DIAGNOSIS — F10231 Alcohol dependence with withdrawal delirium: Secondary | ICD-10-CM

## 2013-10-12 DIAGNOSIS — R000 Tachycardia, unspecified: Secondary | ICD-10-CM | POA: Diagnosis present

## 2013-10-12 DIAGNOSIS — F10931 Alcohol use, unspecified with withdrawal delirium: Secondary | ICD-10-CM | POA: Diagnosis present

## 2013-10-12 DIAGNOSIS — S064XAA Epidural hemorrhage with loss of consciousness status unknown, initial encounter: Secondary | ICD-10-CM | POA: Diagnosis present

## 2013-10-12 DIAGNOSIS — F10239 Alcohol dependence with withdrawal, unspecified: Secondary | ICD-10-CM | POA: Diagnosis present

## 2013-10-12 DIAGNOSIS — I6200 Nontraumatic subdural hemorrhage, unspecified: Secondary | ICD-10-CM

## 2013-10-12 DIAGNOSIS — G9340 Encephalopathy, unspecified: Secondary | ICD-10-CM | POA: Diagnosis present

## 2013-10-12 DIAGNOSIS — S064X9A Epidural hemorrhage with loss of consciousness of unspecified duration, initial encounter: Secondary | ICD-10-CM | POA: Diagnosis present

## 2013-10-12 DIAGNOSIS — J9600 Acute respiratory failure, unspecified whether with hypoxia or hypercapnia: Secondary | ICD-10-CM

## 2013-10-12 HISTORY — PX: CRANIOTOMY: SHX93

## 2013-10-12 LAB — BASIC METABOLIC PANEL
BUN: 13 mg/dL (ref 6–23)
CO2: 22 mEq/L (ref 19–32)
Calcium: 8.4 mg/dL (ref 8.4–10.5)
Chloride: 103 mEq/L (ref 96–112)
Creatinine, Ser: 0.72 mg/dL (ref 0.50–1.35)
GFR calc Af Amer: >90 mL/min (ref >90)
GFR calc non Af Amer: >90 mL/min (ref >90)
Glucose, Bld: 123 mg/dL — ABNORMAL HIGH (ref 70–99)
Potassium: 4.2 mEq/L (ref 3.7–5.3)
Sodium: 140 mEq/L (ref 137–147)

## 2013-10-12 LAB — CBC
HEMATOCRIT: 36.7 % — AB (ref 39.0–52.0)
HEMOGLOBIN: 13.2 g/dL (ref 13.0–17.0)
MCH: 34.4 pg — ABNORMAL HIGH (ref 26.0–34.0)
MCHC: 36 g/dL (ref 30.0–36.0)
MCV: 95.6 fL (ref 78.0–100.0)
Platelets: 179 10*3/uL (ref 150–400)
RBC: 3.84 MIL/uL — ABNORMAL LOW (ref 4.22–5.81)
RDW: 13.7 % (ref 11.5–15.5)
WBC: 14.8 10*3/uL — AB (ref 4.0–10.5)

## 2013-10-12 LAB — COMPREHENSIVE METABOLIC PANEL
ALT: 40 U/L (ref 0–53)
AST: 44 U/L — AB (ref 0–37)
Albumin: 4.3 g/dL (ref 3.5–5.2)
Alkaline Phosphatase: 123 U/L — ABNORMAL HIGH (ref 39–117)
BUN: 12 mg/dL (ref 6–23)
CALCIUM: 9.1 mg/dL (ref 8.4–10.5)
CO2: 26 mEq/L (ref 19–32)
Chloride: 98 mEq/L (ref 96–112)
Creatinine, Ser: 0.72 mg/dL (ref 0.50–1.35)
GFR calc Af Amer: >90 mL/min (ref >90)
GFR calc non Af Amer: >90 mL/min (ref >90)
Glucose, Bld: 135 mg/dL — ABNORMAL HIGH (ref 70–99)
Potassium: 3.9 mEq/L (ref 3.7–5.3)
Sodium: 142 mEq/L (ref 137–147)
TOTAL PROTEIN: 7.6 g/dL (ref 6.0–8.3)
Total Bilirubin: 0.7 mg/dL (ref 0.3–1.2)

## 2013-10-12 LAB — GLUCOSE, CAPILLARY: Glucose-Capillary: 139 mg/dL — ABNORMAL HIGH (ref 70–99)

## 2013-10-12 SURGERY — CRANIOTOMY HEMATOMA EVACUATION EPIDURAL
Anesthesia: General | Site: Head | Laterality: Left

## 2013-10-12 MED ORDER — ONDANSETRON HCL 4 MG/2ML IJ SOLN: 4.0000 mg | INTRAMUSCULAR | Status: DC | PRN

## 2013-10-12 MED ORDER — FENTANYL CITRATE 0.05 MG/ML IJ SOLN
INTRAMUSCULAR | Status: AC
  Administered 2013-10-12: 50 ug
  Filled 2013-10-12: qty 4

## 2013-10-12 MED ORDER — LORAZEPAM 2 MG/ML IJ SOLN
2.0000 mg | Freq: Four times a day (QID) | INTRAMUSCULAR | Status: DC | PRN
  Administered 2013-10-12: 2 mg via INTRAVENOUS
  Filled 2013-10-12 (×5): qty 1

## 2013-10-12 MED ORDER — FENTANYL BOLUS VIA INFUSION
50.0000 ug | INTRAVENOUS | Status: DC | PRN
  Filled 2013-10-12: qty 100

## 2013-10-12 MED ORDER — DEXMEDETOMIDINE HCL IN NACL 200 MCG/50ML IV SOLN
0.4000 ug/kg/h | INTRAVENOUS | Status: DC
  Administered 2013-10-12: 0.4 ug/kg/h via INTRAVENOUS
  Filled 2013-10-12: qty 50

## 2013-10-12 MED ORDER — HYDROMORPHONE HCL PF 1 MG/ML IJ SOLN: 0.5000 mg | INTRAMUSCULAR | Status: DC | PRN

## 2013-10-12 MED ORDER — FENTANYL CITRATE 0.05 MG/ML IJ SOLN
INTRAMUSCULAR | Status: AC
Start: 2013-10-12 — End: 2013-10-13
  Filled 2013-10-12: qty 2

## 2013-10-12 MED ORDER — LABETALOL HCL 5 MG/ML IV SOLN
INTRAVENOUS | Status: DC | PRN
  Administered 2013-10-12: 10 mg via INTRAVENOUS

## 2013-10-12 MED ORDER — CEFAZOLIN SODIUM 1-5 GM-% IV SOLN
1.0000 g | Freq: Three times a day (TID) | INTRAVENOUS | Status: AC
  Administered 2013-10-12 (×2): 1 g via INTRAVENOUS
  Filled 2013-10-12 (×2): qty 50

## 2013-10-12 MED ORDER — ETOMIDATE 2 MG/ML IV SOLN
20.0000 mg | Freq: Once | INTRAVENOUS | Status: AC
  Administered 2013-10-12: 20 mg via INTRAVENOUS

## 2013-10-12 MED ORDER — LORAZEPAM BOLUS VIA INFUSION: 2.0000 mg | Freq: Four times a day (QID) | INTRAVENOUS | Status: DC | PRN

## 2013-10-12 MED ORDER — FENTANYL CITRATE 0.05 MG/ML IJ SOLN
0.0000 ug/h | INTRAMUSCULAR | Status: DC
  Administered 2013-10-12: 100 ug/h via INTRAVENOUS
  Administered 2013-10-13: 135 ug/h via INTRAVENOUS
  Filled 2013-10-12 (×3): qty 50

## 2013-10-12 MED ORDER — MICROFIBRILLAR COLL HEMOSTAT EX PADS
MEDICATED_PAD | CUTANEOUS | Status: DC | PRN
  Administered 2013-10-12: 1 via TOPICAL

## 2013-10-12 MED ORDER — HYDROMORPHONE HCL PF 1 MG/ML IJ SOLN: 0.2500 mg | INTRAMUSCULAR | Status: DC | PRN

## 2013-10-12 MED ORDER — LIDOCAINE HCL (CARDIAC) 20 MG/ML IV SOLN
INTRAVENOUS | Status: DC | PRN
  Administered 2013-10-12: 50 mg via INTRAVENOUS

## 2013-10-12 MED ORDER — HALOPERIDOL LACTATE 5 MG/ML IJ SOLN
5.0000 mg | Freq: Once | INTRAMUSCULAR | Status: AC
  Administered 2013-10-12: 5 mg via INTRAVENOUS

## 2013-10-12 MED ORDER — PROPOFOL 10 MG/ML IV EMUL
INTRAVENOUS | Status: AC
  Administered 2013-10-12: 1000 mg
  Filled 2013-10-12: qty 100

## 2013-10-12 MED ORDER — ACETAMINOPHEN 650 MG RE SUPP: 650.0000 mg | RECTAL | Status: DC | PRN

## 2013-10-12 MED ORDER — DEXTROSE 5 % IV SOLN
INTRAVENOUS | Status: DC | PRN
  Administered 2013-10-12: 06:00:00 via INTRAVENOUS

## 2013-10-12 MED ORDER — HALOPERIDOL LACTATE 5 MG/ML IJ SOLN
INTRAMUSCULAR | Status: AC
  Filled 2013-10-12: qty 1

## 2013-10-12 MED ORDER — SENNA 8.6 MG PO TABS
1.0000 | ORAL_TABLET | Freq: Two times a day (BID) | ORAL | Status: DC
  Administered 2013-10-12: 8.6 mg via ORAL
  Filled 2013-10-12 (×2): qty 1

## 2013-10-12 MED ORDER — NEOSTIGMINE METHYLSULFATE 1 MG/ML IJ SOLN
INTRAMUSCULAR | Status: DC | PRN
  Administered 2013-10-12: 5 mg via INTRAVENOUS

## 2013-10-12 MED ORDER — CEFAZOLIN SODIUM 1-5 GM-% IV SOLN
INTRAVENOUS | Status: AC
  Filled 2013-10-12: qty 100

## 2013-10-12 MED ORDER — PROPOFOL 10 MG/ML IV BOLUS
INTRAVENOUS | Status: DC | PRN
  Administered 2013-10-12: 130 mg via INTRAVENOUS
  Administered 2013-10-12: 70 mg via INTRAVENOUS

## 2013-10-12 MED ORDER — MIDAZOLAM HCL 5 MG/5ML IJ SOLN: INTRAMUSCULAR | Status: DC | PRN

## 2013-10-12 MED ORDER — LORAZEPAM 2 MG/ML IJ SOLN
INTRAMUSCULAR | Status: AC
  Filled 2013-10-12: qty 1

## 2013-10-12 MED ORDER — OXYCODONE HCL 5 MG/5ML PO SOLN: 5.0000 mg | Freq: Once | ORAL | Status: DC | PRN

## 2013-10-12 MED ORDER — METOPROLOL TARTRATE 1 MG/ML IV SOLN: 2.5000 mg | INTRAVENOUS | Status: DC | PRN

## 2013-10-12 MED ORDER — SODIUM CHLORIDE 0.9 % IR SOLN
Status: DC | PRN
  Administered 2013-10-12: 06:00:00

## 2013-10-12 MED ORDER — MIDAZOLAM HCL 2 MG/2ML IJ SOLN
INTRAMUSCULAR | Status: AC
  Filled 2013-10-12: qty 4

## 2013-10-12 MED ORDER — LIDOCAINE-EPINEPHRINE 1 %-1:100000 IJ SOLN
INTRAMUSCULAR | Status: DC | PRN
  Administered 2013-10-12: 5 mL via INTRADERMAL

## 2013-10-12 MED ORDER — ROCURONIUM BROMIDE 100 MG/10ML IV SOLN
INTRAVENOUS | Status: DC | PRN
  Administered 2013-10-12: 40 mg via INTRAVENOUS

## 2013-10-12 MED ORDER — ONDANSETRON HCL 4 MG PO TABS: 4.0000 mg | ORAL_TABLET | ORAL | Status: DC | PRN

## 2013-10-12 MED ORDER — CHLORHEXIDINE GLUCONATE 0.12 % MT SOLN
15.0000 mL | Freq: Two times a day (BID) | OROMUCOSAL | Status: DC
  Administered 2013-10-12 – 2013-10-16 (×9): 15 mL via OROMUCOSAL
  Filled 2013-10-12 (×9): qty 15

## 2013-10-12 MED ORDER — HYDRALAZINE HCL 20 MG/ML IJ SOLN: 10.0000 mg | INTRAMUSCULAR | Status: DC | PRN

## 2013-10-12 MED ORDER — ONDANSETRON HCL 4 MG/2ML IJ SOLN
INTRAMUSCULAR | Status: DC | PRN
  Administered 2013-10-12: 4 mg via INTRAVENOUS

## 2013-10-12 MED ORDER — FENTANYL CITRATE 0.05 MG/ML IJ SOLN
INTRAMUSCULAR | Status: DC | PRN
  Administered 2013-10-12 (×2): 100 ug via INTRAVENOUS
  Administered 2013-10-12 (×2): 150 ug via INTRAVENOUS

## 2013-10-12 MED ORDER — GLYCOPYRROLATE 0.2 MG/ML IJ SOLN
INTRAMUSCULAR | Status: DC | PRN
  Administered 2013-10-12: 0.6 mg via INTRAVENOUS

## 2013-10-12 MED ORDER — CEFAZOLIN SODIUM-DEXTROSE 2-3 GM-% IV SOLR
INTRAVENOUS | Status: DC | PRN
  Administered 2013-10-12: 2 g via INTRAVENOUS

## 2013-10-12 MED ORDER — BIOTENE DRY MOUTH MT LIQD
15.0000 mL | Freq: Two times a day (BID) | OROMUCOSAL | Status: DC
  Administered 2013-10-13 – 2013-10-17 (×9): 15 mL via OROMUCOSAL

## 2013-10-12 MED ORDER — ARTIFICIAL TEARS OP OINT
TOPICAL_OINTMENT | OPHTHALMIC | Status: DC | PRN
  Administered 2013-10-12: 1 via OPHTHALMIC

## 2013-10-12 MED ORDER — NALOXONE HCL 0.4 MG/ML IJ SOLN: 0.0800 mg | INTRAMUSCULAR | Status: DC | PRN

## 2013-10-12 MED ORDER — 0.9 % SODIUM CHLORIDE (POUR BTL) OPTIME
TOPICAL | Status: DC | PRN
  Administered 2013-10-12 (×2): 1000 mL

## 2013-10-12 MED ORDER — SENNA 8.6 MG PO TABS
1.0000 | ORAL_TABLET | Freq: Two times a day (BID) | ORAL | Status: DC
  Administered 2013-10-12 – 2013-10-19 (×6): 8.6 mg
  Filled 2013-10-12 (×15): qty 1

## 2013-10-12 MED ORDER — SPIRITUS FRUMENTI
1.0000 | Freq: Three times a day (TID) | ORAL | Status: DC
  Administered 2013-10-12: 1 via ORAL
  Filled 2013-10-12 (×3): qty 1

## 2013-10-12 MED ORDER — ACETAMINOPHEN 325 MG PO TABS: 650.0000 mg | ORAL_TABLET | ORAL | Status: DC | PRN

## 2013-10-12 MED ORDER — SUCCINYLCHOLINE CHLORIDE 20 MG/ML IJ SOLN
INTRAMUSCULAR | Status: DC | PRN
  Administered 2013-10-12: 100 mg via INTRAVENOUS

## 2013-10-12 MED ORDER — LORAZEPAM 2 MG/ML IJ SOLN
1.0000 mg | INTRAMUSCULAR | Status: DC | PRN
  Administered 2013-10-17: 1 mg via INTRAVENOUS
  Filled 2013-10-12 (×2): qty 1

## 2013-10-12 MED ORDER — HYDROCODONE-ACETAMINOPHEN 5-325 MG PO TABS
1.0000 | ORAL_TABLET | ORAL | Status: DC | PRN
  Administered 2013-10-12: 1 via ORAL
  Filled 2013-10-12: qty 1

## 2013-10-12 MED ORDER — POTASSIUM CHLORIDE IN NACL 20-0.9 MEQ/L-% IV SOLN
INTRAVENOUS | Status: DC
  Administered 2013-10-12 – 2013-10-16 (×10): via INTRAVENOUS
  Filled 2013-10-12 (×18): qty 1000

## 2013-10-12 MED ORDER — LORAZEPAM 2 MG/ML IJ SOLN
1.0000 mg | INTRAMUSCULAR | Status: DC
  Administered 2013-10-12 – 2013-10-16 (×21): 1 mg via INTRAVENOUS
  Filled 2013-10-12 (×22): qty 1

## 2013-10-12 MED ORDER — LORAZEPAM 2 MG/ML IJ SOLN
2.0000 mg | INTRAMUSCULAR | Status: DC | PRN
  Administered 2013-10-12 (×3): 2 mg via INTRAVENOUS

## 2013-10-12 MED ORDER — FENTANYL CITRATE 0.05 MG/ML IJ SOLN: 50.0000 ug | Freq: Once | INTRAMUSCULAR | Status: DC

## 2013-10-12 MED ORDER — LABETALOL HCL 5 MG/ML IV SOLN: 10.0000 mg | INTRAVENOUS | Status: DC | PRN

## 2013-10-12 MED ORDER — PHENYTOIN SODIUM 50 MG/ML IJ SOLN
INTRAMUSCULAR | Status: DC | PRN
  Administered 2013-10-12: 100 mg via INTRAVENOUS

## 2013-10-12 MED ORDER — FENTANYL CITRATE 0.05 MG/ML IJ SOLN
100.0000 ug | Freq: Once | INTRAMUSCULAR | Status: AC
  Administered 2013-10-12: 100 ug via INTRAVENOUS

## 2013-10-12 MED ORDER — PROPOFOL 10 MG/ML IV EMUL
5.0000 ug/kg/min | INTRAVENOUS | Status: DC
  Administered 2013-10-12 (×2): 30 ug/kg/min via INTRAVENOUS
  Administered 2013-10-13: 60 ug/kg/min via INTRAVENOUS
  Administered 2013-10-13: 50 ug/kg/min via INTRAVENOUS
  Administered 2013-10-13: 40 ug/kg/min via INTRAVENOUS
  Administered 2013-10-14 (×2): 60 ug/kg/min via INTRAVENOUS
  Filled 2013-10-12 (×9): qty 100

## 2013-10-12 MED ORDER — ROCURONIUM BROMIDE 50 MG/5ML IV SOLN
50.0000 mg | Freq: Once | INTRAVENOUS | Status: AC
  Administered 2013-10-12: 50 mg via INTRAVENOUS

## 2013-10-12 MED ORDER — OXYCODONE HCL 5 MG PO TABS
5.0000 mg | ORAL_TABLET | Freq: Once | ORAL | Status: DC | PRN
Start: 2013-10-12 — End: 2013-10-12

## 2013-10-12 MED ORDER — DEXAMETHASONE SODIUM PHOSPHATE 10 MG/ML IJ SOLN
INTRAMUSCULAR | Status: DC | PRN
  Administered 2013-10-12: 8 mg via INTRAVENOUS

## 2013-10-12 MED ORDER — PANTOPRAZOLE SODIUM 40 MG IV SOLR: 40.0000 mg | Freq: Every day | INTRAVENOUS | Status: DC

## 2013-10-12 MED ORDER — PROMETHAZINE HCL 25 MG PO TABS: 12.5000 mg | ORAL_TABLET | ORAL | Status: DC | PRN

## 2013-10-12 MED ORDER — THROMBIN 20000 UNITS EX KIT
PACK | CUTANEOUS | Status: DC | PRN
  Administered 2013-10-12: 06:00:00 via TOPICAL

## 2013-10-12 SURGICAL SUPPLY — 75 items
BAG DECANTER FOR FLEXI CONT (MISCELLANEOUS) ×3 IMPLANT
BANDAGE GAUZE 4  KLING STR (GAUZE/BANDAGES/DRESSINGS) IMPLANT
BIT DRILL WIRE PASS 1.3MM (BIT) ×1 IMPLANT
BLADE SURG 11 STRL SS (BLADE) IMPLANT
BNDG COHESIVE 4X5 TAN NS LF (GAUZE/BANDAGES/DRESSINGS) IMPLANT
BRUSH SCRUB EZ 1% IODOPHOR (MISCELLANEOUS) IMPLANT
BRUSH SCRUB EZ PLAIN DRY (MISCELLANEOUS) ×3 IMPLANT
BUR ACORN 6.0 PRECISION (BURR) ×2 IMPLANT
BUR ACORN 6.0MM PRECISION (BURR) ×1
BUR ROUTER D-58 CRANI (BURR) IMPLANT
CANISTER SUCT 3000ML (MISCELLANEOUS) ×3 IMPLANT
CLIP TI MEDIUM 6 (CLIP) IMPLANT
CONT SPEC 4OZ CLIKSEAL STRL BL (MISCELLANEOUS) ×3 IMPLANT
CORDS BIPOLAR (ELECTRODE) ×3 IMPLANT
COVER TABLE BACK 60X90 (DRAPES) ×6 IMPLANT
DERMABOND ADVANCED (GAUZE/BANDAGES/DRESSINGS)
DERMABOND ADVANCED .7 DNX12 (GAUZE/BANDAGES/DRESSINGS) IMPLANT
DRAIN CHANNEL 10M FLAT 3/4 FLT (DRAIN) IMPLANT
DRAPE MICROSCOPE ZEISS OPMI (DRAPES) IMPLANT
DRAPE NEUROLOGICAL W/INCISE (DRAPES) ×3 IMPLANT
DRAPE SURG 17X23 STRL (DRAPES) IMPLANT
DRAPE WARM FLUID 44X44 (DRAPE) ×3 IMPLANT
DRESSING TELFA 8X3 (GAUZE/BANDAGES/DRESSINGS) IMPLANT
DRILL WIRE PASS 1.3MM (BIT) ×3
ELECT CAUTERY BLADE 6.4 (BLADE) ×3 IMPLANT
ELECT REM PT RETURN 9FT ADLT (ELECTROSURGICAL) ×3
ELECTRODE REM PT RTRN 9FT ADLT (ELECTROSURGICAL) ×1 IMPLANT
EVACUATOR SILICONE 100CC (DRAIN) IMPLANT
GAUZE SPONGE 4X4 16PLY XRAY LF (GAUZE/BANDAGES/DRESSINGS) IMPLANT
GLOVE BIO SURGEON STRL SZ 6.5 (GLOVE) ×4 IMPLANT
GLOVE BIO SURGEON STRL SZ7 (GLOVE) ×3 IMPLANT
GLOVE BIO SURGEON STRL SZ7.5 (GLOVE) ×3 IMPLANT
GLOVE BIO SURGEONS STRL SZ 6.5 (GLOVE) ×2
GLOVE ECLIPSE 8.5 STRL (GLOVE) ×3 IMPLANT
GLOVE EXAM NITRILE LRG STRL (GLOVE) IMPLANT
GLOVE EXAM NITRILE MD LF STRL (GLOVE) IMPLANT
GLOVE EXAM NITRILE XL STR (GLOVE) IMPLANT
GLOVE EXAM NITRILE XS STR PU (GLOVE) IMPLANT
GLOVE INDICATOR 7.0 STRL GRN (GLOVE) ×3 IMPLANT
GOWN BRE IMP SLV AUR LG STRL (GOWN DISPOSABLE) IMPLANT
GOWN BRE IMP SLV AUR XL STRL (GOWN DISPOSABLE) IMPLANT
GOWN STRL REIN 2XL LVL4 (GOWN DISPOSABLE) IMPLANT
HEMOSTAT SURGICEL 2X14 (HEMOSTASIS) IMPLANT
HOOK DURA (MISCELLANEOUS) ×3 IMPLANT
KIT BASIN OR (CUSTOM PROCEDURE TRAY) ×3 IMPLANT
KIT ROOM TURNOVER OR (KITS) ×3 IMPLANT
NEEDLE HYPO 25X1 1.5 SAFETY (NEEDLE) ×3 IMPLANT
NS IRRIG 1000ML POUR BTL (IV SOLUTION) ×3 IMPLANT
PACK CRANIOTOMY (CUSTOM PROCEDURE TRAY) ×3 IMPLANT
PAD ARMBOARD 7.5X6 YLW CONV (MISCELLANEOUS) ×3 IMPLANT
PAD EYE OVAL STERILE LF (GAUZE/BANDAGES/DRESSINGS) IMPLANT
PATTIES SURGICAL .25X.25 (GAUZE/BANDAGES/DRESSINGS) IMPLANT
PATTIES SURGICAL .5 X.5 (GAUZE/BANDAGES/DRESSINGS) IMPLANT
PATTIES SURGICAL .5 X3 (DISPOSABLE) IMPLANT
PATTIES SURGICAL 1X1 (DISPOSABLE) IMPLANT
PIN MAYFIELD SKULL DISP (PIN) IMPLANT
PLATE 1.5  2HOLE LNG NEURO (Plate) ×6 IMPLANT
PLATE 1.5 2HOLE LNG NEURO (Plate) ×3 IMPLANT
SCREW SELF DRILL HT 1.5/4MM (Screw) ×18 IMPLANT
SPECIMEN JAR SMALL (MISCELLANEOUS) IMPLANT
SPONGE GAUZE 4X4 12PLY (GAUZE/BANDAGES/DRESSINGS) ×3 IMPLANT
SPONGE LAP 18X18 X RAY DECT (DISPOSABLE) IMPLANT
SPONGE NEURO XRAY DETECT 1X3 (DISPOSABLE) IMPLANT
SPONGE SURGIFOAM ABS GEL 100 (HEMOSTASIS) ×3 IMPLANT
STAPLER VISISTAT 35W (STAPLE) ×3 IMPLANT
SUT NURALON 4 0 TR CR/8 (SUTURE) ×3 IMPLANT
SUT VIC AB 2-0 CT2 18 VCP726D (SUTURE) ×6 IMPLANT
SYR 20ML ECCENTRIC (SYRINGE) ×3 IMPLANT
SYR CONTROL 10ML LL (SYRINGE) ×3 IMPLANT
TAPE CLOTH SURG 4X10 WHT LF (GAUZE/BANDAGES/DRESSINGS) ×3 IMPLANT
TOWEL OR 17X24 6PK STRL BLUE (TOWEL DISPOSABLE) ×3 IMPLANT
TOWEL OR 17X26 10 PK STRL BLUE (TOWEL DISPOSABLE) ×3 IMPLANT
TRAY FOLEY CATH 14FRSI W/METER (CATHETERS) IMPLANT
UNDERPAD 30X30 INCONTINENT (UNDERPADS AND DIAPERS) IMPLANT
WATER STERILE IRR 1000ML POUR (IV SOLUTION) ×3 IMPLANT

## 2013-10-12 NOTE — Progress Notes (Signed)
UR completed.  Jakiyah Stepney, RN BSN M[facility] CCM Trauma/Neuro ICU Case Manager [phone]  

## 2013-10-12 NOTE — Progress Notes (Signed)
Received call from Dr. Jordan LikesPool of plan to take pt to surgery

## 2013-10-12 NOTE — Procedures (Signed)
Oral Intubation Procedure Note   Procedure: Intubation Indications: Respiratory insufficiency, severe agitation Consent: Unable to obtain consent because of altered level of consciousness. Time Out: Verified patient identification, verified procedure, site/side was marked, verified correct patient position, special equipment/implants available, medications/allergies/relevent history reviewed, required imaging and test results available.   Pre-meds: Etomidate 20 mg IV  Neuromuscular blockade: Rocuronium 50 mg IV  Laryngoscope: #4 MAC  Visualization: cords fully visualized  ETT: 7.5 ETT passed on first attempt and secured @ 25 cm at incisors  Findings:  Anterior and deep airway   Evaluation:  CXR ordered to check position of ETT Pt tolerated procedure well without complications   Billy Fischeravid Jarion Hawthorne, MD ; [facility] Mobile [phone].  After 5:30 PM or weekends, call [phone]

## 2013-10-12 NOTE — Anesthesia Procedure Notes (Signed)
Procedure Name: Intubation Date/Time: 10/12/2013 5:37 AM Performed by: Wray KearnsFOLEY, Easton Fetty A Pre-anesthesia Checklist: Patient identified, Timeout performed, Emergency Drugs available, Suction available and Patient being monitored Patient Re-evaluated:Patient Re-evaluated prior to inductionOxygen Delivery Method: Circle system utilized Preoxygenation: Pre-oxygenation with 100% oxygen Intubation Type: IV induction and Cricoid Pressure applied Ventilation: Mask ventilation without difficulty Laryngoscope Size: Mac and 3 Grade View: Grade I Tube type: Subglottic suction tube Tube size: 7.5 mm Number of attempts: 1 Airway Equipment and Method: Stylet Placement Confirmation: ETT inserted through vocal cords under direct vision,  breath sounds checked- equal and bilateral and positive ETCO2 Secured at: 22 cm Tube secured with: Tape Dental Injury: Teeth and Oropharynx as per pre-operative assessment

## 2013-10-12 NOTE — Progress Notes (Signed)
PCCM Brief Event Note   Date: 10/12/2013               Patient Name:  Jason MessingWesley A Swords MRN: [id number]  DOB: Nov 01, 1985 Age / Sex: 28 y.o., male   PCP: No primary provider on file.         Requesting Physician: Dr. Temple PaciniHenry A Pool, MD    Consulting Reason:  Alcohol withdrawal      History of Present Illness: HPI: Mr. Jason Long is a 28 year old male with a history of a generalized seizure disorder thought secondary to long-standing alcohol abuse (since age 28), suffered posterior epidural hematoma after seizure and subsequent fall striking his head onto concrete.  Pt now POD #0 s/p left parietal craniotomy with evacuation now with active delirium. PCCM asked to consult for alcohol withdrawal management.  MD arrived to floor for initial Critical Care consultation for alcohol withdrawal assessment and management.  Pt violently combative, verbally abusive with hallucinations and actively being restrained by 4 hospital personnel. Pt received Haldol 5 mg (1600) and Ativan IM (0.5mg ), Ativan 2 mg IM (~1630 prior) with only minimal improvement.  Pt started on Precedex but pulled out IV.  Staff currently attempting to gain IV assess again.  MD spoke with pt's mother extensively.  Very tearful and feeling as though she would rather see him die than to continue going through this as pt has undergone substance abuse treatment multiple times.  On-call team to follow with full Consult Note.   Signed: Manuela SchwartzKaren-Akosua M Eldean Klatt, MD 10/12/2013, 6:19 PM  Internal Medicine Resident PGY3

## 2013-10-12 NOTE — Progress Notes (Signed)
Pt continues to be agitated and aggressive. Order to increase precedex to 662mcg/kg/hr and bolus 51mcg/kg/hr per Dr. Molli KnockYacoub. Orders to give 2mg  of ativan q445mins per Dr. Molli KnockYacoub. Dr. Sung AmabileSimonds arrived on unit. Pt was intubated and received a  central lined. Pt recieved 50 mg of rocuronium, 20mg  of etomidate, 50 mcg of fentanyl. Propofol and fentanyl drip started. Will continue to monitor.

## 2013-10-12 NOTE — Progress Notes (Signed)
Pt continues to be very anxious and fidgety. The pt is experiencing hot flashes, tremors, and hallucinations. I asked the patient how often he drinks and what other drugs he uses. The patient states that he drinks "a lot" and uses "Marijuana and cocaine". The patient started to cry and expressed his interest in getting help to become sober because "alcohol is going to kill him". Notified Cheri RousJessie Scinto, SCW and made her aware of the situation; she will come see the patient tomorrow. Will continue to monitor.

## 2013-10-12 NOTE — Preoperative (Signed)
Beta Blockers   Reason not to administer Beta Blockers:Not Applicable 

## 2013-10-12 NOTE — Progress Notes (Signed)
Call received from Radiologist, informed of increase from 1.4cm to 1.9cm epidural hematoma with swelling. Dr. Jordan LikesPool paged, notified of Radiologist readings. Informed that he will look at film. Also updated MD on pt's appearance in relation to CIWA presentation (severe hallucinations, severe anxiety/agigtation) with a score of 47. No new orders. Will continue to monitor pt closely.

## 2013-10-12 NOTE — Anesthesia Preprocedure Evaluation (Addendum)
Anesthesia Evaluation  Patient identified by MRN, date of birth, ID band Patient awake    Reviewed: Allergy & Precautions, H&P , NPO status , Patient's Chart, lab work & pertinent test results  Airway Mallampati: I TM Distance: >3 FB Neck ROM: Full    Dental no notable dental hx. (+) Teeth Intact and Dental Advisory Given   Pulmonary Current Smoker,  breath sounds clear to auscultation  Pulmonary exam normal       Cardiovascular negative cardio ROS  Rhythm:Regular Rate:Normal     Neuro/Psych Seizures -,  negative psych ROS   GI/Hepatic negative GI ROS, Neg liver ROS,   Endo/Other  negative endocrine ROS  Renal/GU negative Renal ROS  negative genitourinary   Musculoskeletal   Abdominal   Peds  Hematology negative hematology ROS (+)   Anesthesia Other Findings   Reproductive/Obstetrics negative OB ROS                          Anesthesia Physical Anesthesia Plan  ASA: II and emergent  Anesthesia Plan: General   Post-op Pain Management:    Induction: Intravenous, Rapid sequence and Cricoid pressure planned  Airway Management Planned: Oral ETT  Additional Equipment: Arterial line  Intra-op Plan:   Post-operative Plan: Extubation in OR and Possible Post-op intubation/ventilation  Informed Consent: I have reviewed the patients History and Physical, chart, labs and discussed the procedure including the risks, benefits and alternatives for the proposed anesthesia with the patient or authorized representative who has indicated his/her understanding and acceptance.   Dental advisory given  Plan Discussed with: CRNA  Anesthesia Plan Comments:         Anesthesia Quick Evaluation

## 2013-10-12 NOTE — Anesthesia Postprocedure Evaluation (Signed)
Anesthesia Post Note  Patient: Jason Long  Procedure(s) Performed: Procedure(s) (LRB): CRANIOTOMY HEMATOMA EVACUATION EPIDURAL (Left)  Anesthesia type: general  Patient location: PACU  Post pain: Pain level controlled  Post assessment: Patient's Cardiovascular Status Stable  Last Vitals:  Filed Vitals:   10/12/13 0730  BP: 144/95  Pulse: 85  Temp: 36.7 C  Resp: 21    Post vital signs: Reviewed and stable  Level of consciousness: sedated  Complications: No apparent anesthesia complications

## 2013-10-12 NOTE — Progress Notes (Signed)
Name: Jason Long MRN: [id number] DOB: [birth year]    ADMISSION DATE:  10/11/2013 CONSULTATION DATE:  10/12/2013 REFERRING MD :  Kathaleen Maser Pool. PRIMARY SERVICE: Neurosurgery  CHIEF COMPLAINT: Agitation  BRIEF PATIENT DESCRIPTION: 28 y o Male, With PMH of seizures diagnosed 5 month ago, and signif alcohol abuse. Larey Seat after having a witnessed seizure, and sustained- a left parietal epidural hematoma, had surgical evacuation today. Developed alcohol withdrawal seizures.   SIGNIFICANT EVENTS / STUDIES:  10/11/2013- Head Ct - Bilateral scalp hematomas 10/12/2013- Head Ct-  Interval increase in size of left posterior parietal epidural hematoma 10/12/2013- Left Parietal Epidural hematoma Evacuation.   LINES / TUBES: ETT 1/20 >>  L [place] CVL 1/20 >>   CULTURES: None.  ANTIBIOTICS: Peri- Op- Iv cefazolin.  HISTORY OF PRESENT ILLNESS:  Pt was agitated, and later intubated and sedated and so a hx could not be obtained form him. Family was not present, Hx as per previous documentation. 28 Y O Male with PMH of Seziures and alcohol abuse. Presented yesterday- 10/11/2013, pt had a seizure, while at work, fell from a truck and hit is head on the floor. On arrival in the ED pt complained of headache, stating it was the worst he had experienced. He was diagnosed with seizures about 5 months ago, has had 4 seizures since, does not take valproic acid because it is too expensive.   Last alcohol intake, was the morning of presentation to the Ed, prior to seizure, little quantity according to patient.   PAST MEDICAL HISTORY :  Past Medical History  Diagnosis Date  . Seizure   . Traumatic epidural hematoma    Past Surgical History  Procedure Laterality Date  . Dental surgery     Prior to Admission medications   Medication Sig Start Date End Date Taking? Authorizing Provider  ibuprofen (ADVIL,MOTRIN) 800 MG tablet Take 800 mg by mouth every 8 (eight) hours as needed for moderate pain.   Yes Historical  Provider, MD  Valproic Acid 250 MG CPDR Take 500 capsules by mouth 2 (two) times daily.   Yes Historical Provider, MD   No Known Allergies  FAMILY HISTORY:  History reviewed. No pertinent family history. SOCIAL HISTORY:  reports that he has been smoking Cigarettes.  He has been smoking about 0.50 packs per day. He has never used smokeless tobacco. He reports that he drinks alcohol. He reports that he uses illicit drugs (Marijuana).  REVIEW OF SYSTEMS:  Could not be ascertained.  SUBJECTIVE:   VITAL SIGNS: Temp:  [97.4 F (36.3 C)-98.2 F (36.8 C)] 97.7 F (36.5 C) (01/20 1200) Pulse Rate:  [77-120] 115 (01/20 1856) Resp:  [11-25] 20 (01/20 1856) BP: (91-158)/(54-102) 158/85 mmHg (01/20 1856) SpO2:  [90 %-100 %] 100 % (01/20 1856) Arterial Line BP: (136-176)/(72-97) 136/72 mmHg (01/20 0900) FiO2 (%):  [40 %] 40 % (01/20 1856) HEMODYNAMICS:   VENTILATOR SETTINGS: Vent Mode:  [-] PRVC FiO2 (%):  [40 %] 40 % Set Rate:  [20 bmp] 20 bmp Vt Set:  [500 mL] 500 mL PEEP:  [5 cmH20] 5 cmH20 Plateau Pressure:  [13 cmH20] 13 cmH20 INTAKE / OUTPUT: Intake/Output     01/20 0701 - 01/21 0700   P.O. 240   I.V. (mL/kg) 800 (12.3)   IV Piggyback 50   Total Intake(mL/kg) 1090 (16.8)   Urine (mL/kg/hr) 270 (0.3)   Blood    Total Output 270   Net +820         PHYSICAL  EXAMINATION: General:  Sedated, ETT in place, appears as stated age. Neuro:  Sedated. HEENT: ~30cm surgical incision in Left parietal region, with metal sutures in place, Pupils constricted- 3mm, reactive to light. Cardiovascular: Tachycardic, regular rhythm, no murmurs, rubns of gallops. Lungs: On ventilator, Coarse breath sounds, present bilat. Abdomen:  Soft, no palpable masses or organomegaly, bowel sound present. Musculoskeletal:  No pedal edema, peripheral pulses- DP present. Skin:  No rash, warm, diaphoretic.  LABS:  CBC  Recent Labs Lab 10/11/13 1330 10/12/13 0850  WBC 11.4* 14.8*  HGB 16.4 13.2  HCT  44.5 36.7*  PLT 226 179   Coag's No results found for this basename: APTT, INR,  in the last 168 hours BMET  Recent Labs Lab 10/11/13 1330 10/12/13 0330 10/12/13 0850  NA 137 142 140  K 4.5 3.9 4.2  CL 93* 98 103  CO2 26 26 22   BUN 9 12 13   CREATININE 0.75 0.72 0.72  GLUCOSE 119* 135* 123*   Electrolytes  Recent Labs Lab 10/11/13 1330 10/12/13 0330 10/12/13 0850  CALCIUM 10.3 9.1 8.4   Sepsis Markers No results found for this basename: LATICACIDVEN, PROCALCITON, O2SATVEN,  in the last 168 hours ABG No results found for this basename: PHART, PCO2ART, PO2ART,  in the last 168 hours Liver Enzymes  Recent Labs Lab 10/12/13 0330  AST 44*  ALT 40  ALKPHOS 123*  BILITOT 0.7  ALBUMIN 4.3   Cardiac Enzymes No results found for this basename: TROPONINI, PROBNP,  in the last 168 hours Glucose  Recent Labs Lab 10/11/13 1218  GLUCAP 179*    Imaging Ct Head Wo Contrast  10/12/2013   CLINICAL DATA:  Acute seizure; hit head on concrete. Headache. Follow up known left posterior epidural hematoma.  EXAM: CT HEAD WITHOUT CONTRAST  TECHNIQUE: Contiguous axial images were obtained from the base of the skull through the vertex without intravenous contrast.  COMPARISON:  CT of the head performed 10/11/2013  FINDINGS: The patient's left posterior parietal epidural hematoma has increased further in size, now measuring 1.9 cm in maximal thickness. There is a thin extension of the epidural hematoma more inferiorly overlying the left occipital lobe, measuring 6 mm in thickness; this is grossly unchanged from the prior study. Minimal associated vasogenic edema is seen, reflecting mass effect. There is suggestion of 3-4 mm of rightward midline shift posteriorly, without evidence of subfalcine herniation.  The posterior fossa, including the cerebellum, brainstem and fourth ventricle, is within normal limits. The third and lateral ventricles, and basal ganglia are unremarkable in appearance.   There is no evidence of fracture; visualized osseous structures are unremarkable in appearance. The visualized portions of the orbits are within normal limits. The paranasal sinuses and mastoid air cells are well-aerated. Scattered soft tissue swelling is noted about the vertex.  IMPRESSION: Interval increase in size of left posterior parietal epidural hematoma, now measuring 1.9 cm in maximal thickness. Thin extension of epidural hematoma seen more inferiorly, overlying the left occipital lobe, measuring 6 mm in thickness. Minimal associated vasogenic edema noted, reflecting mass effect. Suggestion of 3-4 mm of rightward midline shift posteriorly, without evidence of subfalcine herniation.  These results were called by telephone at the time of interpretation on 10/12/2013 at 3:33 AM to Nursing on [id number], who verbally acknowledged these results.   Electronically Signed   By: Roanna Raider M.D.   On: 10/12/2013 03:46   Ct Head Wo Contrast  10/11/2013   CLINICAL DATA:  Larey Seat off truck after a seizure.  Head trauma.  EXAM: CT HEAD WITHOUT CONTRAST  CT CERVICAL SPINE WITHOUT CONTRAST  TECHNIQUE: Multidetector CT imaging of the head and cervical spine was performed following the standard protocol without intravenous contrast. Multiplanar CT image reconstructions of the cervical spine were also generated.  COMPARISON:  01/27/2011.  FINDINGS: CT HEAD FINDINGS  There is a 14 mm thick hyperdense biconvex extra-axial blood collection in the left parietal region consistent with an acute subdural or epidural hematoma. There is a linear subtle lucency in the left posterior calvarium on image 26, 27 and 28 series 4 which could represent a nondisplaced skull fracture although this is in the region of the lambdoid suture. There are bilateral scalp hematomas in the right parietal greater than left parieto-occipital regions. No midline shift. No parenchymal contusion. Sinuses and mastoids clear.  CT CERVICAL SPINE FINDINGS  There  is no visible cervical spine fracture, traumatic subluxation, prevertebral soft tissue swelling, or intraspinal hematoma. Slight reversal of the normal cervical lordotic curve. No neck masses. Small air cyst anteriorly right lung apex without visible pneumothorax.  IMPRESSION: 14 mm thick biconvex extra-axial blood collection left parietal region consistent with acute subdural or epidural hematoma. Bilateral scalp hematomas. Equivocal/nondisplaced left parieto-occipital skull fracture.  Critical Value/emergent results were called by telephone at the time of interpretation on 10/11/2013 at 1:53 PM to Dr. Johnnette Gourd , who verbally acknowledged these results.  No visible cervical spine fracture.   Electronically Signed   By: Davonna Belling M.D.   On: 10/11/2013 14:09   Ct Cervical Spine Wo Contrast  10/11/2013   CLINICAL DATA:  Larey Seat off truck after a seizure.  Head trauma.  EXAM: CT HEAD WITHOUT CONTRAST  CT CERVICAL SPINE WITHOUT CONTRAST  TECHNIQUE: Multidetector CT imaging of the head and cervical spine was performed following the standard protocol without intravenous contrast. Multiplanar CT image reconstructions of the cervical spine were also generated.  COMPARISON:  01/27/2011.  FINDINGS: CT HEAD FINDINGS  There is a 14 mm thick hyperdense biconvex extra-axial blood collection in the left parietal region consistent with an acute subdural or epidural hematoma. There is a linear subtle lucency in the left posterior calvarium on image 26, 27 and 28 series 4 which could represent a nondisplaced skull fracture although this is in the region of the lambdoid suture. There are bilateral scalp hematomas in the right parietal greater than left parieto-occipital regions. No midline shift. No parenchymal contusion. Sinuses and mastoids clear.  CT CERVICAL SPINE FINDINGS  There is no visible cervical spine fracture, traumatic subluxation, prevertebral soft tissue swelling, or intraspinal hematoma. Slight reversal of the  normal cervical lordotic curve. No neck masses. Small air cyst anteriorly right lung apex without visible pneumothorax.  IMPRESSION: 14 mm thick biconvex extra-axial blood collection left parietal region consistent with acute subdural or epidural hematoma. Bilateral scalp hematomas. Equivocal/nondisplaced left parieto-occipital skull fracture.  Critical Value/emergent results were called by telephone at the time of interpretation on 10/11/2013 at 1:53 PM to Dr. Johnnette Gourd , who verbally acknowledged these results.  No visible cervical spine fracture.   Electronically Signed   By: Davonna Belling M.D.   On: 10/11/2013 14:09     CXR: ETT and central line in good position.  ASSESSMENT / PLAN:  PULMONARY A: Acute respiratory failure due to severe AMS/agitation P:   Vent settings established Vent bundle implemented Daily SBT as indicated  CARDIOVASCULAR A: Sinus Tachycardia- 2/2 Alcohol withdrawal     HTN- 2/2 Alcohol withdrawal P:  - Metoprolol-  2.5-5mg  Q3H PRN for HR >115. - Hydralazine- 10-40mg  Q4H PRN for Sys >170.  RENAL A:  None P:   Monitor BMET intermittently Correct electrolytes as indicated  GASTROINTESTINAL A:  None P:   SUP: PPI - Orogastric tube in place, Commence tube feeds tomorrow.  - Senokot 8.6mg  BID per NS  HEMATOLOGIC A:  None P:  DVT px: SCDs Monitor CBC intermittently  INFECTIOUS A:  Leukocytosis- 14.8, Likely stress response P:   Micro and abx as above  ENDOCRINE A:  Mild Hyperglycemia without hx of DM P:   - CBGs Q4H - Consider SSI for blood sugars >180.  NEUROLOGIC A:  Acute Encephalopathy- 2/2 to alcohol withdrawal.   Traumatic epidural hematoma- S/p Craniotomy and Evacuation   Delirium Tremens with severe agitation   Seizures P:   Propofol and fentanyl infusions Scheduled and PRN Ativan Cont Phenytoin per NS Post op management per neurosurgery. - Seizure precautions.  TODAY'S SUMMARY: 28 Y O male had craniotomy and evacuation of  epidural hematoma today. Developed alcohol withdrawal today requiring Sedation, ETT and central line placement today.   I have personally obtained a history, examined the patient, evaluated laboratory and imaging results, formulated the assessment and plan and placed orders. CRITICAL CARE: The patient is critically ill with multiple organ systems failure and requires high complexity decision making for assessment and support, frequent evaluation and titration of therapies, application of advanced monitoring technologies and extensive interpretation of multiple databases. Critical Care Time devoted to patient care services described in this note is 50 minutes.    Inocente SallesEjiro Emokpae, MD- PGY-1. Pager- [phone]  10/12/2013, 7:18 PM  PCCM ATTENDING: I have interviewed and examined the patient and reviewed the database. I have formulated the assessment and plan as reflected in the note above with amendments made by me. 50 mins of direct critical care time provided  Billy Fischeravid Simonds, MD;  PCCM service; Mobile [phone]

## 2013-10-12 NOTE — Progress Notes (Signed)
The patient remains restless and somewhat agitated with moderately severe headache. No seizures. No other neurologic symptoms.  Followup head CT scan this morning shows enlargement of the extra axial blood clot. Scan most consistent with an enlarging epidural hematoma.  Worsening left-sided parietal epidural hematoma with increasing mass effect. I discussed situation with the patient. I recommended that we proceed emergently the operating room for craniotomy and evacuation of this blood clot. I've discussed the risks and benefits. He wishes to proceed.

## 2013-10-12 NOTE — Transfer of Care (Signed)
Immediate Anesthesia Transfer of Care Note  Patient: Jason MessingWesley A Long  Procedure(s) Performed: Procedure(s): CRANIOTOMY HEMATOMA EVACUATION EPIDURAL (Left)  Patient Location: NICU  Anesthesia Type:General  Level of Consciousness: awake and responds to stimulation  Airway & Oxygen Therapy: Patient Spontanous Breathing  Post-op Assessment: Report given to PACU RN, Post -op Vital signs reviewed and stable, Patient moving all extremities and Patient moving all extremities X 4  Post vital signs: Reviewed and stable  Complications: No apparent anesthesia complications

## 2013-10-12 NOTE — Op Note (Signed)
Date of procedure: 10/12/2013  Date of dictation: same  Service: Neurosurgery  Preoperative diagnosis: acute, posttraumatic left parietal epidural hematoma  Postoperative diagnosis: same  Procedure Name: left parietal craniotomy  With evacuation of epidural hematoma  Surgeon:Shantae Vantol A.Desmund Elman, M.D.  Asst. Surgeon: none  Anesthesia: General  Indication:28 year old male status post seizure and subsequent fall with resultant head injury. Workup demonstrates evidence of an enlarging left parietal epidural hematoma. Patient presents now for craniotomy and evacuation of hematoma.  Operative note:after induction of anesthesia, patient positioned supine with his left shoulder elevated and his head fixed in a [place] pin headrest. Patient's left scalp was prepped and draped sterilely. Linear incision was made over his parietal occipital scalp. Self-retaining retractor was placed. Craniotomy was then performed using the high-speed drill. Bone flap elevated. Epidural hematoma was identified and  Evacuated using gentle suction. There is no evidence of dural laceration. There is no evidence of underlying subdural hematoma. All elements the epidural hematoma were removed. There was no active bleeding apparent. Tack up sutures were placed around the inferior periphery of the craniotomy. Wound is then irrigated. Bone flap replaced and held in place of OsteoMed plates. Scalp closed the typical fashion. No apparent complications.

## 2013-10-12 NOTE — Brief Op Note (Signed)
10/11/2013 - 10/12/2013  6:40 AM  PATIENT:  Jason Long  28 y.o. male  PRE-OPERATIVE DIAGNOSIS:  epidural   POST-OPERATIVE DIAGNOSIS:  * No post-op diagnosis entered *  PROCEDURE:  Procedure(s): CRANIOTOMY HEMATOMA EVACUATION EPIDURAL (Left)  SURGEON:  Surgeon(s) and Role:    * Temple PaciniHenry A Devanny Palecek, MD - Primary  PHYSICIAN ASSISTANT:   ASSISTANTS:    ANESTHESIA:   general  EBL:  Total I/O In: 2276 [I.V.:2000; IV Piggyback:276] Out: 450 [Urine:350; Blood:100]  BLOOD ADMINISTERED:none  DRAINS: none   LOCAL MEDICATIONS USED:  LIDOCAINE   SPECIMEN:  No Specimen  DISPOSITION OF SPECIMEN:  N/A  COUNTS:  YES  TOURNIQUET:  * No tourniquets in log *  DICTATION: .Dragon Dictation  PLAN OF CARE: Admit to inpatient   PATIENT DISPOSITION:  ICU - extubated and stable.   Delay start of Pharmacological VTE agent (>24hrs) due to surgical blood loss or risk of bleeding: yes

## 2013-10-12 NOTE — Progress Notes (Signed)
Pt having severe auditory/visual hallucination, tremors, heat flashes. Pt becoming very agitated and aggressive; pulling off all equipment and trying to get out of bed. Security has been called. Notified Dr. Jordan LikesPool; 5mg  of haldol given at 1559. Pt then pulled out IV. Pt continue to be agitated and aggressive. Dr. Jordan LikesPool at bedside; 2mg  of ativan given. Dr. Jordan LikesPool ordered restraints, alcoholic beverage at meals, and a CCM consult.  Dr. Molli KnockYacoub notified of issues;  Precedex drip started at 1700. Will continue to monitor.

## 2013-10-12 NOTE — Procedures (Signed)
PROCEDURE NOTE: CVL PLACEMENT  INDICATION:    Monitoring of central venous pressures and/or administration of medications optimally administered in central vein  CONSENT:   Performed as urgent procedure. A time out was performed to review patient identification, procedure to be performed, correct patient position, medications/allergies/relevent history, required imaging and test results.  PROCEDURE  Maximum sterile technique was used including antiseptics, cap, gloves, gown, hand hygiene, mask and sheet.  Skin prep: Chlorhexidine; local anesthetic administered  A antimicrobial bonded/coated triple lumen catheter was placed in the L [place] vein using the Seldinger technique.   EVALUATION:  Blood flow good  Complications: No apparent complications  Patient tolerated the procedure well.  Chest X-ray ordered to verify placement and is pending   Billy Fischeravid Salley Boxley, MD PCCM service Mobile [phone]

## 2013-10-13 ENCOUNTER — Inpatient Hospital Stay (HOSPITAL_COMMUNITY): Payer: Self-pay

## 2013-10-13 DIAGNOSIS — G9340 Encephalopathy, unspecified: Secondary | ICD-10-CM

## 2013-10-13 LAB — BASIC METABOLIC PANEL
BUN: 10 mg/dL (ref 6–23)
CO2: 22 meq/L (ref 19–32)
Calcium: 8.7 mg/dL (ref 8.4–10.5)
Chloride: 104 mEq/L (ref 96–112)
Creatinine, Ser: 0.71 mg/dL (ref 0.50–1.35)
GFR calc Af Amer: >90 mL/min (ref >90)
GLUCOSE: 75 mg/dL (ref 70–99)
Potassium: 3.9 mEq/L (ref 3.7–5.3)
Sodium: 139 mEq/L (ref 137–147)

## 2013-10-13 LAB — GLUCOSE, CAPILLARY
GLUCOSE-CAPILLARY: 81 mg/dL (ref 70–99)
GLUCOSE-CAPILLARY: 68 mg/dL — AB (ref 70–99)
GLUCOSE-CAPILLARY: 106 mg/dL — AB (ref 70–99)
GLUCOSE-CAPILLARY: 71 mg/dL (ref 70–99)
Glucose-Capillary: 63 mg/dL — ABNORMAL LOW (ref 70–99)
Glucose-Capillary: 128 mg/dL — ABNORMAL HIGH (ref 70–99)
Glucose-Capillary: 87 mg/dL (ref 70–99)
Glucose-Capillary: 66 mg/dL — ABNORMAL LOW (ref 70–99)

## 2013-10-13 LAB — CBC
HCT: 31.3 % — ABNORMAL LOW (ref 39.0–52.0)
Hemoglobin: 10.9 g/dL — ABNORMAL LOW (ref 13.0–17.0)
MCH: 33.7 pg (ref 26.0–34.0)
MCHC: 34.8 g/dL (ref 30.0–36.0)
MCV: 96.9 fL (ref 78.0–100.0)
PLATELETS: 141 10*3/uL — AB (ref 150–400)
RBC: 3.23 MIL/uL — ABNORMAL LOW (ref 4.22–5.81)
RDW: 13.9 % (ref 11.5–15.5)
WBC: 8.1 10*3/uL (ref 4.0–10.5)

## 2013-10-13 MED ORDER — PRO-STAT SUGAR FREE PO LIQD
30.0000 mL | Freq: Two times a day (BID) | ORAL | Status: DC
  Administered 2013-10-13 – 2013-10-14 (×2): 30 mL
  Filled 2013-10-13 (×3): qty 30

## 2013-10-13 MED ORDER — CLONIDINE HCL 0.1 MG PO TABS
0.1000 mg | ORAL_TABLET | Freq: Three times a day (TID) | ORAL | Status: DC
  Administered 2013-10-13 – 2013-10-17 (×13): 0.1 mg via ORAL
  Filled 2013-10-13 (×18): qty 1

## 2013-10-13 MED ORDER — SODIUM CHLORIDE 0.9 % IV SOLN
Freq: Once | INTRAVENOUS | Status: DC
  Filled 2013-10-13: qty 100

## 2013-10-13 MED ORDER — DEXTROSE 50 % IV SOLN
INTRAVENOUS | Status: AC
Start: 2013-10-13 — End: 2013-10-13
  Filled 2013-10-13: qty 50

## 2013-10-13 MED ORDER — DEXTROSE 50 % IV SOLN
25.0000 mL | Freq: Once | INTRAVENOUS | Status: AC | PRN
  Administered 2013-10-13: 25 mL via INTRAVENOUS

## 2013-10-13 MED ORDER — ADULT MULTIVITAMIN LIQUID CH
5.0000 mL | Freq: Every day | ORAL | Status: DC
  Administered 2013-10-13 – 2013-10-17 (×4): 5 mL
  Filled 2013-10-13 (×7): qty 5

## 2013-10-13 MED ORDER — SODIUM CHLORIDE 0.9 % IV SOLN
Freq: Once | INTRAVENOUS | Status: AC
  Administered 2013-10-13: 20:00:00 via INTRAVENOUS
  Filled 2013-10-13: qty 100

## 2013-10-13 MED ORDER — SODIUM CHLORIDE 0.9 % IV SOLN
INTRAVENOUS | Status: DC
  Filled 2013-10-13 (×7): qty 100

## 2013-10-13 MED ORDER — PHENOBARBITAL SODIUM 130 MG/ML IJ SOLN: 1000.0000 mg | Freq: Once | INTRAMUSCULAR | Status: DC

## 2013-10-13 MED ORDER — DEXTROSE 50 % IV SOLN
25.0000 mL | Freq: Once | INTRAVENOUS | Status: AC | PRN
  Administered 2013-10-13: 25 mL via INTRAVENOUS
  Filled 2013-10-13: qty 50

## 2013-10-13 MED ORDER — PIVOT 1.5 CAL PO LIQD
1000.0000 mL | ORAL | Status: DC
  Administered 2013-10-13: 1000 mL
  Filled 2013-10-13 (×4): qty 1000

## 2013-10-13 MED ORDER — PHENOBARBITAL SODIUM 65 MG/ML IJ SOLN
65.0000 mg | Freq: Two times a day (BID) | INTRAMUSCULAR | Status: DC
  Administered 2013-10-13 – 2013-10-19 (×12): 65 mg via INTRAVENOUS
  Filled 2013-10-13 (×12): qty 1

## 2013-10-13 NOTE — Progress Notes (Signed)
Patient intubated and sedated overnight secondary to agitation and restlessness. Currently deeply sedated but will awaken when sedation reversed. Moving all 4 extremities strongly.  Afebrile. Heart rate and blood pressure control is currently. Urine output adequate.  Status post craniotomy for epidural hematoma. Patient with significant perioperative agitation. Unclear whether symptoms are secondary to baseline mental illness versus early alcohol withdrawal. Continue sedation per critical care. Extubate whenever feasible from their standpoint.

## 2013-10-13 NOTE — Progress Notes (Addendum)
Name: Jason Long MRN: [id number] DOB: 1986-03-29    ADMISSION DATE:  10/11/2013 CONSULTATION DATE:  10/12/2013 REFERRING MD :  Kathaleen Maser Pool. PRIMARY SERVICE: Neurosurgery  CHIEF COMPLAINT: Agitation  BRIEF PATIENT DESCRIPTION:    Pt was agitated, and later intubated and sedated and so a hx could not be obtained form him. Family was not present, Hx as per previous documentation. 28 Y O Male with PMH of Seziures and alcohol abuse. Presented yesterday- 10/11/2013, pt had a seizure, while at work, fell from a truck and hit is head on the floor. On arrival in the ED pt complained of headache, stating it was the worst he had experienced. He was diagnosed with seizures about 5 months ago, has had 4 seizures since, does not take valproic acid because it is too expensive.  Last alcohol intake, was the morning of presentation to the Ed, prior to seizure, little quantity according to patient. Per RN on 10/13/13: last drink was 10/09/13 per family and patient and on 10/12/13 prior to intubation: hallucinating severely, diaphoretic, RASS +5 (running around room, needed security), and CIWA 45. Received 8mg  ativan and 5mg  haldol + precedex gtt at very high dose 2.5 in the immediate preceding 12h prior to intubation  SIGNIFICANT EVENTS / STUDIES:  10/11/2013- Head Ct - Bilateral scalp hematomas 10/12/2013- Head Ct-  Interval increase in size of left posterior parietal epidural hematoma 10/12/2013- Left Parietal Epidural hematoma Evacuation.   LINES / TUBES: ETT 1/20 >>  L [place] CVL 1/20 >>   CULTURES: Results for orders placed during the hospital encounter of 10/11/13  MRSA PCR SCREENING     Status: Abnormal   Collection Time    10/11/13  3:49 PM      Result Value Range Status   MRSA by PCR POSITIVE (*) NEGATIVE Final   Comment:            The GeneXpert MRSA Assay (FDA     approved for NASAL specimens     only), is one component of a     comprehensive MRSA colonization     surveillance program. It  is not     intended to diagnose MRSA     infection nor to guide or     monitor treatment for     MRSA infections.     RESULT CALLED TO, READ BACK BY AND VERIFIED WITH:     J.JONES AT 1936 BY L.PITT 10/11/13     ANTIBIOTICS: Peri- Op- Iv cefazolin.   SUBJECTIVE/OVERNIGHT/INTERVAL HX  10/13/13: Extremely agitated per RN intermittently with fent gtt 135 and diprivan 40. Moves all 4s but can follow commands   VITAL SIGNS: Temp:  [97.5 F (36.4 C)-98.5 F (36.9 C)] 97.5 F (36.4 C) (01/21 0801) Pulse Rate:  [25-160] 77 (01/21 0900) Resp:  [12-25] 20 (01/21 0900) BP: (91-158)/(43-102) 100/56 mmHg (01/21 0900) SpO2:  [85 %-100 %] 100 % (01/21 0900) FiO2 (%):  [40 %] 40 % (01/21 0900) HEMODYNAMICS:   VENTILATOR SETTINGS: Vent Mode:  [-] PRVC FiO2 (%):  [40 %] 40 % Set Rate:  [20 bmp] 20 bmp Vt Set:  [500 mL] 500 mL PEEP:  [5 cmH20] 5 cmH20 Plateau Pressure:  [13 cmH20-14 cmH20] 14 cmH20 INTAKE / OUTPUT: Intake/Output     01/20 0701 - 01/21 0700 01/21 0701 - 01/22 0700   P.O. 340    I.V. (mL/kg) 2787.7 (43) 227 (3.5)   IV Piggyback 50    Total Intake(mL/kg) 3177.7 (49) 227 (  3.5)   Urine (mL/kg/hr) 1370 (0.9)    Blood     Total Output 1370     Net +1807.7 +227          PHYSICAL EXAMINATION: General:  Sedated, ETT in place, appears as stated age. Looks critically ill Neuro:  Currently sedated with diprivan and fent gtt. Calm but on stimulus gets very agitated HEENT: ~8cm surgical incision in Left parietal region, with metal sutures in place, Pupils constricted- 3mm, reactive to light. Cardiovascular: Tachycardic, regular rhythm, no murmurs, rubns of gallops. Lungs: On ventilator, Coarse breath sounds, present bilat. Abdomen:  Soft, no palpable masses or organomegaly, bowel sound present. Musculoskeletal:  No pedal edema, peripheral pulses- DP present. Skin:  No rash, warm, diaphoretic.  LABS: PULMONARY No results found for this basename: PHART, PCO2, PCO2ART, PO2,  PO2ART, HCO3, TCO2, O2SAT,  in the last 168 hours  CBC  Recent Labs Lab 10/11/13 1330 10/12/13 0850 10/13/13 0505  HGB 16.4 13.2 10.9*  HCT 44.5 36.7* 31.3*  WBC 11.4* 14.8* 8.1  PLT 226 179 141*    COAGULATION No results found for this basename: INR,  in the last 168 hours  CARDIAC  No results found for this basename: TROPONINI,  in the last 168 hours No results found for this basename: PROBNP,  in the last 168 hours   CHEMISTRY  Recent Labs Lab 10/11/13 1330 10/12/13 0330 10/12/13 0850 10/13/13 0505  NA 137 142 140 139  K 4.5 3.9 4.2 3.9  CL 93* 98 103 104  CO2 26 26 22 22   GLUCOSE 119* 135* 123* 75  BUN 9 12 13 10   CREATININE 0.75 0.72 0.72 0.71  CALCIUM 10.3 9.1 8.4 8.7   Estimated Creatinine Clearance: 124.9 ml/min (by C-G formula based on Cr of 0.71).   LIVER  Recent Labs Lab 10/12/13 0330  AST 44*  ALT 40  ALKPHOS 123*  BILITOT 0.7  PROT 7.6  ALBUMIN 4.3     INFECTIOUS No results found for this basename: LATICACIDVEN, PROCALCITON,  in the last 168 hours   ENDOCRINE CBG (last 3)   Recent Labs  10/11/13 1218 10/12/13 2009  GLUCAP 179* 139*         IMAGING x48h  Ct Head Wo Contrast  10/12/2013   CLINICAL DATA:  Acute seizure; hit head on concrete. Headache. Follow up known left posterior epidural hematoma.  EXAM: CT HEAD WITHOUT CONTRAST  TECHNIQUE: Contiguous axial images were obtained from the base of the skull through the vertex without intravenous contrast.  COMPARISON:  CT of the head performed 10/11/2013  FINDINGS: The patient's left posterior parietal epidural hematoma has increased further in size, now measuring 1.9 cm in maximal thickness. There is a thin extension of the epidural hematoma more inferiorly overlying the left occipital lobe, measuring 6 mm in thickness; this is grossly unchanged from the prior study. Minimal associated vasogenic edema is seen, reflecting mass effect. There is suggestion of 3-4 mm of rightward  midline shift posteriorly, without evidence of subfalcine herniation.  The posterior fossa, including the cerebellum, brainstem and fourth ventricle, is within normal limits. The third and lateral ventricles, and basal ganglia are unremarkable in appearance.  There is no evidence of fracture; visualized osseous structures are unremarkable in appearance. The visualized portions of the orbits are within normal limits. The paranasal sinuses and mastoid air cells are well-aerated. Scattered soft tissue swelling is noted about the vertex.  IMPRESSION: Interval increase in size of left posterior parietal epidural hematoma, now  measuring 1.9 cm in maximal thickness. Thin extension of epidural hematoma seen more inferiorly, overlying the left occipital lobe, measuring 6 mm in thickness. Minimal associated vasogenic edema noted, reflecting mass effect. Suggestion of 3-4 mm of rightward midline shift posteriorly, without evidence of subfalcine herniation.  These results were called by telephone at the time of interpretation on 10/12/2013 at 3:33 AM to Nursing on [id number], who verbally acknowledged these results.   Electronically Signed   By: Roanna Raider M.D.   On: 10/12/2013 03:46   Ct Head Wo Contrast  10/11/2013   CLINICAL DATA:  Larey Seat off truck after a seizure.  Head trauma.  EXAM: CT HEAD WITHOUT CONTRAST  CT CERVICAL SPINE WITHOUT CONTRAST  TECHNIQUE: Multidetector CT imaging of the head and cervical spine was performed following the standard protocol without intravenous contrast. Multiplanar CT image reconstructions of the cervical spine were also generated.  COMPARISON:  01/27/2011.  FINDINGS: CT HEAD FINDINGS  There is a 14 mm thick hyperdense biconvex extra-axial blood collection in the left parietal region consistent with an acute subdural or epidural hematoma. There is a linear subtle lucency in the left posterior calvarium on image 26, 27 and 28 series 4 which could represent a nondisplaced skull fracture  although this is in the region of the lambdoid suture. There are bilateral scalp hematomas in the right parietal greater than left parieto-occipital regions. No midline shift. No parenchymal contusion. Sinuses and mastoids clear.  CT CERVICAL SPINE FINDINGS  There is no visible cervical spine fracture, traumatic subluxation, prevertebral soft tissue swelling, or intraspinal hematoma. Slight reversal of the normal cervical lordotic curve. No neck masses. Small air cyst anteriorly right lung apex without visible pneumothorax.  IMPRESSION: 14 mm thick biconvex extra-axial blood collection left parietal region consistent with acute subdural or epidural hematoma. Bilateral scalp hematomas. Equivocal/nondisplaced left parieto-occipital skull fracture.  Critical Value/emergent results were called by telephone at the time of interpretation on 10/11/2013 at 1:53 PM to Dr. Johnnette Gourd , who verbally acknowledged these results.  No visible cervical spine fracture.   Electronically Signed   By: Davonna Belling M.D.   On: 10/11/2013 14:09   Ct Cervical Spine Wo Contrast  10/11/2013   CLINICAL DATA:  Larey Seat off truck after a seizure.  Head trauma.  EXAM: CT HEAD WITHOUT CONTRAST  CT CERVICAL SPINE WITHOUT CONTRAST  TECHNIQUE: Multidetector CT imaging of the head and cervical spine was performed following the standard protocol without intravenous contrast. Multiplanar CT image reconstructions of the cervical spine were also generated.  COMPARISON:  01/27/2011.  FINDINGS: CT HEAD FINDINGS  There is a 14 mm thick hyperdense biconvex extra-axial blood collection in the left parietal region consistent with an acute subdural or epidural hematoma. There is a linear subtle lucency in the left posterior calvarium on image 26, 27 and 28 series 4 which could represent a nondisplaced skull fracture although this is in the region of the lambdoid suture. There are bilateral scalp hematomas in the right parietal greater than left  parieto-occipital regions. No midline shift. No parenchymal contusion. Sinuses and mastoids clear.  CT CERVICAL SPINE FINDINGS  There is no visible cervical spine fracture, traumatic subluxation, prevertebral soft tissue swelling, or intraspinal hematoma. Slight reversal of the normal cervical lordotic curve. No neck masses. Small air cyst anteriorly right lung apex without visible pneumothorax.  IMPRESSION: 14 mm thick biconvex extra-axial blood collection left parietal region consistent with acute subdural or epidural hematoma. Bilateral scalp hematomas. Equivocal/nondisplaced left parieto-occipital skull fracture.  Critical Value/emergent results were called by telephone at the time of interpretation on 10/11/2013 at 1:53 PM to Dr. Johnnette GourdOBYN ALBERT , who verbally acknowledged these results.  No visible cervical spine fracture.   Electronically Signed   By: Davonna BellingJohn  Curnes M.D.   On: 10/11/2013 14:09   Dg Chest Port 1 View  10/13/2013   CLINICAL DATA:  Respiratory failure  EXAM: PORTABLE CHEST - 1 VIEW  COMPARISON:  Portable chest x-ray of 12 October 2013  FINDINGS: The endotracheal tube tip lies 3.5 cm above the crotch of the carina. The left subclavian venous catheter tip lies in the region of the midportion of the SVC. The esophagogastric tube tip in proximal port project below the left hemidiaphragm. The cardiopericardial silhouette is normal in size. The pulmonary vascularity is not engorged. The mediastinum is normal in width. There is no pleural effusion or pneumothorax. The observed portions of the bony thorax appear normal.  IMPRESSION: 1. There is no evidence of pneumonia nor CHF nor other acute cardiopulmonary abnormality. 2. The support tubes and lines appear to be in reasonable position.   Electronically Signed   By: David  SwazilandJordan   On: 10/13/2013 07:32   Dg Chest Port 1 View  10/12/2013   CLINICAL DATA:  Alcohol withdrawal, seizure activity, intubated  EXAM: PORTABLE CHEST - 1 VIEW  COMPARISON:  None.   FINDINGS: Endotracheal tube 3.4 cm above the carina. Left subclavian central line tip at the mid SVC level. Normal heart size. Lungs clear. No effusion or pneumothorax. No osseous abnormality.  IMPRESSION: Support apparatus in good position.  No acute chest process.   Electronically Signed   By: Ruel Favorsrevor  Shick M.D.   On: 10/12/2013 19:55   Dg Abd Portable 1v  10/12/2013   CLINICAL DATA:  Orogastric tube placement.  EXAM: PORTABLE ABDOMEN - 1 VIEW  COMPARISON:  10/12/2013 abdominal exam and chest x-ray.  FINDINGS: Feeding tube tip gastric fundus level.  Gas-filled nondilated stomach. Gas filled prominent size loops of small bowel and colon.  The possibility of free intraperitoneal air cannot be assessed on a supine view.  Endotracheal tube tip 3.7 cm above the carina. Left central line tip mid superior vena cava level.  IMPRESSION: Feeding tube tip gastric fundus.  Please see above.   Electronically Signed   By: Bridgett LarssonSteve  Olson M.D.   On: 10/12/2013 21:24   Dg Abd Portable 1v  10/12/2013   CLINICAL DATA:  New orogastric tube  EXAM: PORTABLE ABDOMEN - 1 VIEW  COMPARISON:  CT ABD/PELVIS W CM dated 02/18/2013; DG CHEST 1V PORT dated 10/12/2013  FINDINGS: Single supine view centered over the upper abdomen. Endotracheal and left-sided subclavian lines are unchanged. No orogastric or nasogastric tube is identified. Nonobstructive bowel gas pattern. Clear lungs.  IMPRESSION: No orogastric tube identified. Question whether this is in the oropharynx.   Electronically Signed   By: Jeronimo GreavesKyle  Talbot M.D.   On: 10/12/2013 20:52       ASSESSMENT / PLAN:  PULMONARY A: Acute respiratory failure due to severe AMS/agitation (RASS +4). S/p intubation 10/12/13  10/13/13: Does not meet SBT criteria due to agitation P:   Vent settings established Vent bundle implemented Daily SBT as indicated  CARDIOVASCULAR A: Sinus Tachycardia- 2/2 Alcohol withdrawal     HTN- 2/2 Alcohol withdrawal P:  - Metoprolol- 2.5-5mg  Q3H PRN for HR  >115. - Hydralazine- 10-40mg  Q4H PRN for Sys >170.  RENAL A:  None P:   Monitor BMET intermittently Correct electrolytes as indicated  GASTROINTESTINAL A:  None P:   SUP: PPI - Orogastric tube in place, Commence tube feeds tomorrow.  - Senokot 8.6mg  BID per NS  HEMATOLOGIC A:  Anemia of critical illness P:  DVT px: SCDs Monitor CBC intermittently PRBC forhgb <7gm%  INFECTIOUS A:  Leukocytosis- 14.8, Likely stress response P:   Micro and abx as above  ENDOCRINE A:  Mild Hyperglycemia without hx of DM P:   - CBGs Q4H - Consider SSI for blood sugars >180.  NEUROLOGIC A:  Acute Encephalopathy- 2/2 to alcohol withdrawal. (Urine tox also positive 10/11/13 for cocaine and marijuana)   Traumatic epidural hematoma- S/p Craniotomy and Evacuation   Delirium Tremens with severe agitation   Seizures  10/13/13: Hx reviewed. Features all c/w SEVERE DTs. High risk delirium candidate due to coccaine and MJ on board along with CNS injury Currently on diprivan and fent gtt  P:   Propofol and fentanyl infusions; RN instructed to wean off fentanyl to extent possible and increase ativan Add clonidone tab 0.1mg  tid; will alow for fentanyl and ativan wean Scheduled 1mg  q4h ativan and PRN Ativan Add precedex if needed Cont Phenytoin per NS Post op management per neurosurgery. - Seizure precautions.  TODAY'S SUMMARY: Patine with sevee enceophalopathy. Cannot be extubated. High pre-test for severe DTs at this point.  No family at bedside   D.w Dr Dutch Quint at bedside    The patient is critically ill with multiple organ systems failure and requires high complexity decision making for assessment and support, frequent evaluation and titration of therapies, application of advanced monitoring technologies and extensive interpretation of multiple databases.   Critical Care Time devoted to patient care services described in this note is  35  Minutes.  Dr. Kalman Shan, M.D.,  [facility].C.P Pulmonary and Critical Care Medicine Staff Physician [facility] Pager: [phone], If no answer or between  15:00h - 7:00h: call [phone]  10/13/2013 9:31 AM

## 2013-10-13 NOTE — Progress Notes (Signed)
Hypoglycemic Event  CBG: 68   Treatment: D50 IV 25 mL  Symptoms: None (on vent- sedated)  Follow-up CBG: Time:0830 CBG Result:106  Possible Reasons for Event: Inadequate meal intake  Comments/MD notified:Dr. Marchelle Gearingamaswamy ordered to start tube feeding    Merleen NicelySharpe, Bellami Farrelly Lea  Remember to initiate Hypoglycemia Order Set & complete

## 2013-10-13 NOTE — Progress Notes (Signed)
eLink Physician-Brief Progress Note Patient Name: Autumn MessingWesley A Kamphuis DOB: April 30, 1986 MRN: [id number]  Date of Service  10/13/2013   HPI/Events of Note   Restraints expired.  eICU Interventions  Renewed for 12 hours, please renew in AM.      Dorrine Montone 10/13/2013, 6:57 PM

## 2013-10-13 NOTE — Progress Notes (Signed)
The patients father Jason Long(Lee Age) took the patients personal belongings home (black book bag with laptop and cell phone inside).

## 2013-10-13 NOTE — Progress Notes (Signed)
Patient has not voided since foley was removed by patient this afternoon. Bladder scan revealed 535 cc of urine. Dr. Herma CarsonZ (ccm) on the floor and consulted about issue. Verbal order to insert foley catheter. No other problems at this time. Will continue to monitor. Jacqulynn CadetPotter, Daria Mcmeekin A

## 2013-10-13 NOTE — Progress Notes (Signed)
INITIAL NUTRITION ASSESSMENT  DOCUMENTATION CODES Per approved criteria  -Not Applicable   INTERVENTION:  1. Initiate Pivot 1.5 @ 30 ml/hr   2. 30 ml Prostat BID.    3. MVI daily  Tube feeding regimen provides 1280 kcal, 97 grams of protein, and 546 ml of H2O.  Tube feeding regimen and current Propofol rate providing 1895 total kcal (101% of needs)  NUTRITION DIAGNOSIS: Inadequate oral intake related to inability to eat as evidenced by NPO status.  Goal: Pt to meet >/= 90% of their estimated nutrition needs   Monitor:  Vent status, TF initiation and tolerance, labs, weight trend  Reason for Assessment: Consult received to initiate and manage enteral nutrition support.  28 y.o. male  Admitting Dx: <principal problem not specified>  ASSESSMENT: Pt with recently dx with seizures and ETOH abuse. Pt had not been taking his seizure medication due to expense. Pt fell off truck while at work hitting his head on the floor.  Pt with traumatic SDH s/p craniotomy and evacuation.  Pt re-intubated due to acute encephalopathy due to alcohol withdrawal (Urine also positive for cocaine and marijuana). Pt with severe agitation and altered mental status prior to intubation.  Pt likely with poor nutrition quality PTA due to substance abuse history.   Consult received for TF management.  Patient is currently intubated on ventilator support.  MV: 12 L/min Temp (24hrs), Avg:98 F (36.7 C), Min:97.5 F (36.4 C), Max:98.5 F (36.9 C)  Propofol: 23.3 ml/hr providing 615 kcal/day from lipid  Nutrition Focused Physical Exam:  Subcutaneous Fat:  Orbital Region: WNL Upper Arm Region: WNL Thoracic and Lumbar Region: WNL  Muscle:  Temple Region: mild depletion Clavicle Bone Region: WNL Clavicle and Acromion Bone Region: WNL Scapular Bone Region: NA Dorsal Hand: WNL Patellar Region: WNL Anterior Thigh Region: WNL Posterior Calf Region: WNL  Edema: not present   Height: Ht Readings  from Last 1 Encounters:  10/11/13 6' (1.829 m)    Weight: Wt Readings from Last 1 Encounters:  10/11/13 142 lb 13.7 oz (64.8 kg)    Ideal Body Weight: 80.9 kg   % Ideal Body Weight: 80%  Wt Readings from Last 10 Encounters:  10/11/13 142 lb 13.7 oz (64.8 kg)  10/11/13 142 lb 13.7 oz (64.8 kg)    Usual Body Weight: unknown  % Usual Body Weight: -  BMI:  Body mass index is 19.37 kg/(m^2).  Estimated Nutritional Needs: Kcal: 1875 Protein: 95-110 grams Fluid: > 1.8 L/day  Skin: left head incision   Diet Order:    EDUCATION NEEDS: -No education needs identified at this time   Intake/Output Summary (Last 24 hours) at 10/13/13 1353 Last data filed at 10/13/13 1300  Gross per 24 hour  Intake 3081.95 ml  Output   1535 ml  Net 1546.95 ml    Last BM: PTA   Labs:   Recent Labs Lab 10/12/13 0330 10/12/13 0850 10/13/13 0505  NA 142 140 139  K 3.9 4.2 3.9  CL 98 103 104  CO2 26 22 22   BUN 12 13 10   CREATININE 0.72 0.72 0.71  CALCIUM 9.1 8.4 8.7  GLUCOSE 135* 123* 75    CBG (last 3)   Recent Labs  10/13/13 0756 10/13/13 0835 10/13/13 1150  GLUCAP 68* 106* 87    Scheduled Meds: . antiseptic oral rinse  15 mL Mouth Rinse q12n4p  . chlorhexidine  15 mL Mouth Rinse BID  . Chlorhexidine Gluconate Cloth  6 each Topical Q0600  .  cloNIDine  0.1 mg Oral TID  . LORazepam  1 mg Intravenous Q4H  . mupirocin ointment  1 application Nasal BID  . pantoprazole (PROTONIX) IV  40 mg Intravenous QHS  . phenytoin (DILANTIN) IV  100 mg Intravenous Q8H  . senna  1 tablet Per Tube BID    Continuous Infusions: . 0.9 % NaCl with KCl 20 mEq / L 100 mL/hr at 10/13/13 1300  . fentaNYL infusion INTRAVENOUS 75 mcg/hr (10/13/13 1300)  . propofol 60 mcg/kg/min (10/13/13 1300)    Past Medical History  Diagnosis Date  . Seizure   . Traumatic epidural hematoma     Past Surgical History  Procedure Laterality Date  . Dental surgery      Kendell Bane RD, LDN,  CNSC [phone] Pager [phone] After Hours Pager

## 2013-10-13 NOTE — Clinical Social Work Note (Signed)
Clinical Social Work Department BRIEF PSYCHOSOCIAL ASSESSMENT 10/13/2013  Patient:  Jason Long, Jason Long     Account Number:  [id number]     Admit date:  10/11/2013  Clinical Social Worker:  Myles Lipps  Date/Time:  10/12/2013 11:00 AM  Referred by:  RN  Date Referred:  10/12/2013 Referred for  Substance Abuse   Other Referral:   Interview type:  Patient Other interview type:   Spoke with patient family in waiting area post intubation    PSYCHOSOCIAL DATA Living Status:  PARENTS Admitted from facility:   Level of care:   Primary support name:  Jason Long, Jason Long   [phone] Primary support relationship to patient:  PARENT Degree of support available:   Strong    CURRENT CONCERNS Current Concerns  Substance Abuse   Other Concerns:    SOCIAL WORK ASSESSMENT / PLAN Clinical Social Worker met with patient at bedside, per patient request, to offer support and discuss patient current substance use.  Patient states that he lives at home with his father in [place] and is currently enrolled in school at [employer] for [employer]. Patient openly admits to parents and nursing staff that he is a heavy drinker and does cocaine occassionally.    Clinical Social Worker went back to visit with patient later in the day and patient was experiencing severe DT's with hallucinations, agitation, aggressive behavior, and eventually became sedated and intubated by MD.  CSW spoke at length with patient parents who agree that patient will likely not agree to inpatient treatment due to his drive to finish school but may be open to the idea of intensive outpatient.  CSW will follow up with patient directly regarding his plans at discharge and treatment options once extubated.  CSW remains available for support and to faciltiate patient discharge needs once medically stable.   Assessment/plan status:  Psychosocial Support/Ongoing Assessment of Needs Other assessment/ plan:   Information/referral to  community resources:   No SBIRT completed at this time due to patient severe DT's. CSW to complete once patient has cleared.  CSW provided patient family with contact information to call with questions/concerns throughout patient hospitalization.    PATIENT'S/FAMILY'S RESPONSE TO PLAN OF CARE: Patient initially alert and oriented x3, however quickly declined and became aggressive requiring security assistance.  Patient family understands that patient is currently experiencing DT's and expressed their fears for patient well being.  CSW explained the course for which patient may follow through hospitalization to better prepare them for the upcoming days.  Patient family very appreciative and agreeable to all assistance provided from [place].

## 2013-10-14 ENCOUNTER — Inpatient Hospital Stay (HOSPITAL_COMMUNITY): Payer: Self-pay

## 2013-10-14 LAB — CK TOTAL AND CKMB (NOT AT ARMC)
CK, MB: 11.8 ng/mL — AB (ref 0.3–4.0)
CK, MB: 8.4 ng/mL (ref 0.3–4.0)
Relative Index: 1.2 (ref 0.0–2.5)
Relative Index: 1 (ref 0.0–2.5)
Total CK: 968 U/L — ABNORMAL HIGH (ref 7–232)
Total CK: 853 U/L — ABNORMAL HIGH (ref 7–232)

## 2013-10-14 LAB — GLUCOSE, CAPILLARY
GLUCOSE-CAPILLARY: 123 mg/dL — AB (ref 70–99)
Glucose-Capillary: 103 mg/dL — ABNORMAL HIGH (ref 70–99)
Glucose-Capillary: 105 mg/dL — ABNORMAL HIGH (ref 70–99)
Glucose-Capillary: 119 mg/dL — ABNORMAL HIGH (ref 70–99)
Glucose-Capillary: 127 mg/dL — ABNORMAL HIGH (ref 70–99)
Glucose-Capillary: 78 mg/dL (ref 70–99)
Glucose-Capillary: 94 mg/dL (ref 70–99)

## 2013-10-14 LAB — BASIC METABOLIC PANEL
BUN: 12 mg/dL (ref 6–23)
CO2: 20 meq/L (ref 19–32)
CREATININE: 0.64 mg/dL (ref 0.50–1.35)
Calcium: 8.1 mg/dL — ABNORMAL LOW (ref 8.4–10.5)
Chloride: 109 mEq/L (ref 96–112)
GFR calc Af Amer: >90 mL/min (ref >90)
Glucose, Bld: 107 mg/dL — ABNORMAL HIGH (ref 70–99)
Potassium: 3.8 mEq/L (ref 3.7–5.3)
Sodium: 141 mEq/L (ref 137–147)

## 2013-10-14 LAB — TROPONIN I: Troponin I: <0.3 ng/mL (ref <0.30)

## 2013-10-14 LAB — CREATININE, URINE, RANDOM: Creatinine, Urine: 430.65 mg/dL

## 2013-10-14 LAB — PHOSPHORUS: Phosphorus: 3 mg/dL (ref 2.3–4.6)

## 2013-10-14 LAB — LACTIC ACID, PLASMA: Lactic Acid, Venous: 0.7 mmol/L (ref 0.5–2.2)

## 2013-10-14 LAB — SODIUM, URINE, RANDOM: Sodium, Ur: 33 mEq/L

## 2013-10-14 LAB — MAGNESIUM: MAGNESIUM: 1.8 mg/dL (ref 1.5–2.5)

## 2013-10-14 MED ORDER — SODIUM CHLORIDE 0.9 % IV BOLUS (SEPSIS)
1000.0000 mL | Freq: Once | INTRAVENOUS | Status: AC
  Administered 2013-10-14: 1000 mL via INTRAVENOUS

## 2013-10-14 MED ORDER — MAGNESIUM SULFATE 40 MG/ML IJ SOLN
2.0000 g | Freq: Once | INTRAMUSCULAR | Status: AC
  Administered 2013-10-14: 2 g via INTRAVENOUS
  Filled 2013-10-14: qty 50

## 2013-10-14 MED ORDER — PROPOFOL 10 MG/ML IV EMUL
5.0000 ug/kg/min | INTRAVENOUS | Status: DC
  Administered 2013-10-14: 50 ug/kg/min via INTRAVENOUS
  Administered 2013-10-14 – 2013-10-15 (×5): 60 ug/kg/min via INTRAVENOUS
  Filled 2013-10-14 (×6): qty 100

## 2013-10-14 MED ORDER — FENTANYL CITRATE 0.05 MG/ML IJ SOLN
100.0000 ug | INTRAMUSCULAR | Status: DC | PRN
  Administered 2013-10-14 – 2013-10-16 (×3): 100 ug via INTRAVENOUS
  Filled 2013-10-14 (×4): qty 2

## 2013-10-14 MED ORDER — PIVOT 1.5 CAL PO LIQD
1000.0000 mL | ORAL | Status: DC
Start: 2013-10-14 — End: 2013-10-16
  Administered 2013-10-14 (×2): 1000 mL
  Filled 2013-10-14 (×4): qty 1000

## 2013-10-14 MED ORDER — DEXMEDETOMIDINE HCL IN NACL 200 MCG/50ML IV SOLN
0.0000 ug/kg/h | INTRAVENOUS | Status: DC
  Administered 2013-10-14: 0.5 ug/kg/h via INTRAVENOUS
  Administered 2013-10-15: 0.1 ug/kg/h via INTRAVENOUS
  Filled 2013-10-14 (×3): qty 50

## 2013-10-14 MED ORDER — HALOPERIDOL LACTATE 5 MG/ML IJ SOLN
5.0000 mg | Freq: Four times a day (QID) | INTRAMUSCULAR | Status: DC
  Administered 2013-10-14 – 2013-10-16 (×8): 5 mg via INTRAVENOUS
  Filled 2013-10-14 (×14): qty 1

## 2013-10-14 NOTE — Progress Notes (Signed)
Dr. Marchelle Gearingamaswamy notified of precedex being ineffective to keep patient calm. Pt very agitated and attempting to reach for ETT and get out of bed despite restraints. Orders received to restart propofol, 1L NS bolus and to recheck CKMB at 1500. Will monitor.

## 2013-10-14 NOTE — Progress Notes (Signed)
eLink Physician-Brief Progress Note Patient Name: Jason Long DOB: 1985-09-30 MRN: [id number]  Date of Service  10/14/2013   HPI/Events of Note   cvp 9  eICU Interventions  FLuid 1L over 2h chk FENa   Intervention Category Intermediate Interventions: Oliguria - evaluation and management  Sally-Ann Cutbirth V. 10/14/2013, 12:40 AM

## 2013-10-14 NOTE — Progress Notes (Signed)
Dr. Vassie LollAlva called regarding low urine output. CVP 9, 500 cc bolus of ns started. Will continue to monitor. Jason Long, [name] A

## 2013-10-14 NOTE — Progress Notes (Signed)
NUTRITION FOLLOW UP  Intervention:    Increase Pivot 1.5 by 10 ml every 4 hours to goal rate of 55 ml/hr.   D/C Prostat  TF regimen provides 1980 kcal, 123 grams protein, and   Nutrition Dx:   Inadequate oral intake related to inability to eat as evidenced by NPO status; ongoing.   Goal:  Pt to meet >/= 90% of their estimated nutrition needs, met.   Monitor:  Vent status, TF initiation and tolerance, labs, weight trend  Assessment:   Pt with recently dx with seizures and ETOH abuse. Pt had not been taking his seizure medication due to expense. Pt fell off truck while at work hitting his head on the floor.  Pt with traumatic SDH s/p craniotomy and evacuation.  Pt re-intubated due to acute encephalopathy due to alcohol withdrawal (Urine also positive for cocaine and marijuana). Pt with severe agitation and altered mental status prior to intubation.  Pt likely with poor nutrition quality PTA due to substance abuse history.   Patient is currently intubated on ventilator support.  MV: 13.7 L/min Temp (24hrs), Avg:97.8 F (36.6 C), Min:97.5 F (36.4 C), Max:98.3 F (36.8 C)  During rounds MD reports that Propofol will be d/c'ed due to elevated CK.  Pt being provided additional potassium, magnesium, and a MVI.   Pivot 1.5 @ 30 ml/hr with 30 ml Prostat BID.  Tube feeding regimen provides 1280 kcal, 97 grams of protein, and 546 ml of H2O.    Height: Ht Readings from Last 1 Encounters:  10/11/13 6' (1.829 m)    Weight Status:   Wt Readings from Last 1 Encounters:  10/14/13 153 lb 3.5 oz (69.5 kg)  Admission weight 142 lb (64.8 kg)  Re-estimated needs:  Kcal: 1922 Protein: 95-110 grams Fluid: >1.9 L/day  Skin: left head incision   Diet Order:     Intake/Output Summary (Last 24 hours) at 10/14/13 1132 Last data filed at 10/14/13 1000  Gross per 24 hour  Intake 4276.46 ml  Output    860 ml  Net 3416.46 ml    Last BM: 1/18   Labs:   Recent Labs Lab  10/12/13 0850 10/13/13 0505 10/14/13 0432  NA 140 139 141  K 4.2 3.9 3.8  CL 103 104 109  CO2 22 22 20   BUN 13 10 12   CREATININE 0.72 0.71 0.64  CALCIUM 8.4 8.7 8.1*  MG  --   --  1.8  PHOS  --   --  3.0  GLUCOSE 123* 75 107*    CBG (last 3)   Recent Labs  10/14/13 0012 10/14/13 0452 10/14/13 0756  GLUCAP 78 94 105*    Scheduled Meds: . antiseptic oral rinse  15 mL Mouth Rinse q12n4p  . chlorhexidine  15 mL Mouth Rinse BID  . Chlorhexidine Gluconate Cloth  6 each Topical Q0600  . cloNIDine  0.1 mg Oral TID  . feeding supplement (PIVOT 1.5 CAL)  1,000 mL Per Tube Q24H  . feeding supplement (PRO-STAT SUGAR FREE 64)  30 mL Per Tube BID  . haloperidol lactate  5 mg Intravenous Q6H  . LORazepam  1 mg Intravenous Q4H  . magnesium sulfate 1 - 4 g bolus IVPB  2 g Intravenous Once  . multivitamin  5 mL Per Tube Daily  . mupirocin ointment  1 application Nasal BID  . pantoprazole (PROTONIX) IV  40 mg Intravenous QHS  . PHENObarbital  65 mg Intravenous BID  . phenytoin (DILANTIN) IV  100 mg  Intravenous Q8H  . senna  1 tablet Per Tube BID    Continuous Infusions: . 0.9 % NaCl with KCl 20 mEq / L 100 mL/hr at 10/14/13 1000  . dexmedetomidine      [place], [place], [place] Pager [phone] After Hours Pager

## 2013-10-14 NOTE — Progress Notes (Signed)
Fentanyl drip discontinued.  Wasted 35ml. in sink.  Witnessed by Juleen Starraryn Hutton, RN. Niamya Vittitow C

## 2013-10-14 NOTE — Progress Notes (Signed)
Name: Jason Long MRN: [id number] DOB: May 11, 1986    ADMISSION DATE:  10/11/2013 CONSULTATION DATE:  10/12/2013 REFERRING MD :  Kathaleen MaserHenry A Pool. PRIMARY SERVICE: Neurosurgery  CHIEF COMPLAINT: Agitation  BRIEF PATIENT DESCRIPTION:    Pt was agitated, and later intubated and sedated and so a hx could not be obtained form him. Family was not present, Hx as per previous documentation. 28 Y O Male with PMH of Seziures and alcohol abuse. Presented yesterday- 10/11/2013, pt had a seizure, while at work, fell from a truck and hit is head on the floor. On arrival in the ED pt complained of headache, stating it was the worst he had experienced. He was diagnosed with seizures about 5 months ago, has had 4 seizures since, does not take valproic acid because it is too expensive.  Last alcohol intake, was the morning of presentation to the Ed, prior to seizure, little quantity according to patient. Per RN on 10/13/13: last drink was 10/09/13 per family and patient and on 10/12/13 prior to intubation: hallucinating severely, diaphoretic, RASS +5 (running around room, needed security), and CIWA 45. Received 8mg  ativan and 5mg  haldol + precedex gtt at very high dose 2.5 in the immediate preceding 12h prior to intubation  LINES / TUBES: ETT 1/20 >>  L [place] CVL 1/20 >>   CULTURES: Results for orders placed during the hospital encounter of 10/11/13  MRSA PCR SCREENING     Status: Abnormal   Collection Time    10/11/13  3:49 PM      Result Value Range Status   MRSA by PCR POSITIVE (*) NEGATIVE Final   Comment:            The GeneXpert MRSA Assay (FDA     approved for NASAL specimens     only), is one component of a     comprehensive MRSA colonization     surveillance program. It is not     intended to diagnose MRSA     infection nor to guide or     monitor treatment for     MRSA infections.     RESULT CALLED TO, READ BACK BY AND VERIFIED WITH:     J.JONES AT 1936 BY L.PITT 10/11/13      ANTIBIOTICS: Peri- Op- Iv cefazolin.   SIGNIFICANT EVENTS / STUDIES:  10/11/2013- Head Ct - Bilateral scalp hematomas 10/12/2013- Head Ct-  Interval increase in size of left posterior parietal epidural hematoma 10/12/2013- Left Parietal Epidural hematoma Evacuation. 10/13/13: Extremely agitated per RN intermittently with fent gtt 135 and diprivan 40. Moves all 4s but can follow commands   SUBJECTIVE/OVERNIGHT/INTERVAL HX  10/14/13: Very agitated again on WUA; maybe some better. Needing diprivan (CK 1000) and fent gtt to maintain calm. Neurosuirg considering perioperative mania in ddx.  VITAL SIGNS: Temp:  [97.5 F (36.4 C)-98.3 F (36.8 C)] 98.3 F (36.8 C) (01/22 0400) Pulse Rate:  [68-103] 80 (01/22 1000) Resp:  [18-30] 20 (01/22 1000) BP: (92-127)/(50-96) 127/66 mmHg (01/22 1000) SpO2:  [100 %] 100 % (01/22 1000) FiO2 (%):  [40 %] 40 % (01/22 1000) Weight:  [69.5 kg (153 lb 3.5 oz)] 69.5 kg (153 lb 3.5 oz) (01/22 0500) HEMODYNAMICS: CVP:  [9 mmHg-12 mmHg] 12 mmHg VENTILATOR SETTINGS: Vent Mode:  [-] PRVC FiO2 (%):  [40 %] 40 % Set Rate:  [20 bmp] 20 bmp Vt Set:  [500 mL] 500 mL PEEP:  [5 cmH20] 5 cmH20 Plateau Pressure:  [13 cmH20-16 cmH20] 16 cmH20  INTAKE / OUTPUT: Intake/Output     01/21 0701 - 01/22 0700 01/22 0701 - 01/23 0700   P.O. 100    I.V. (mL/kg) 2973.6 (42.8) 375.5 (5.4)   NG/GT 254 90   IV Piggyback 1000    Total Intake(mL/kg) 4327.6 (62.3) 465.5 (6.7)   Urine (mL/kg/hr) 950 (0.6) 135 (0.5)   Total Output 950 135   Net +3377.6 +330.5          PHYSICAL EXAMINATION: General:  Sedated, ETT in place, appears as stated age. Looks critically ill Neuro:  Currently sedated with diprivan and fent gtt. WUA gets very agitated but seems to follow commands HEENT: ~8cm surgical incision in Left parietal region, with metal sutures in place, Pupils constricted- 3mm, reactive to light. Cardiovascular: Tachycardic, regular rhythm, no murmurs, rubns of  gallops. Lungs: On ventilator, Coarse breath sounds, present bilat. Abdomen:  Soft, no palpable masses or organomegaly, bowel sound present. Musculoskeletal:  No pedal edema, peripheral pulses- DP present. Skin:  No rash, warm, diaphoretic.  LABS: PULMONARY No results found for this basename: PHART, PCO2, PCO2ART, PO2, PO2ART, HCO3, TCO2, O2SAT,  in the last 168 hours  CBC  Recent Labs Lab 10/11/13 1330 10/12/13 0850 10/13/13 0505  HGB 16.4 13.2 10.9*  HCT 44.5 36.7* 31.3*  WBC 11.4* 14.8* 8.1  PLT 226 179 141*    COAGULATION No results found for this basename: INR,  in the last 168 hours  CARDIAC    Recent Labs Lab 10/14/13 0815  TROPONINI <0.30   No results found for this basename: PROBNP,  in the last 168 hours   CHEMISTRY  Recent Labs Lab 10/11/13 1330 10/12/13 0330 10/12/13 0850 10/13/13 0505 10/14/13 0432  NA 137 142 140 139 141  K 4.5 3.9 4.2 3.9 3.8  CL 93* 98 103 104 109  CO2 26 26 22 22 20   GLUCOSE 119* 135* 123* 75 107*  BUN 9 12 13 10 12   CREATININE 0.75 0.72 0.72 0.71 0.64  CALCIUM 10.3 9.1 8.4 8.7 8.1*  MG  --   --   --   --  1.8  PHOS  --   --   --   --  3.0   Estimated Creatinine Clearance: 133.9 ml/min (by C-G formula based on Cr of 0.64).   LIVER  Recent Labs Lab 10/12/13 0330  AST 44*  ALT 40  ALKPHOS 123*  BILITOT 0.7  PROT 7.6  ALBUMIN 4.3     INFECTIOUS  Recent Labs Lab 10/14/13 0433  LATICACIDVEN 0.7     ENDOCRINE CBG (last 3)   Recent Labs  10/14/13 0012 10/14/13 0452 10/14/13 0756  GLUCAP 78 94 105*         IMAGING x48h  Dg Chest Port 1 View  10/14/2013   CLINICAL DATA:  Endotracheal tube.  EXAM: PORTABLE CHEST - 1 VIEW  COMPARISON:  One-view chest 10/13/2013  FINDINGS: The endotracheal tube terminates 4 cm above the chronic, in satisfactory position. A left subclavian line is in place. The NG tube courses off the inferior border of the film. Opacities over the right lung are in part  external to the patient. There is now mild pulmonary vascular congestion.  IMPRESSION: 1. The support apparatus is stable. 2. Interval development of mild pulmonary vascular congestion.   Electronically Signed   By: Gennette Pac M.D.   On: 10/14/2013 07:44   Dg Chest Port 1 View  10/13/2013   CLINICAL DATA:  Respiratory failure  EXAM: PORTABLE CHEST -  1 VIEW  COMPARISON:  Portable chest x-ray of 12 October 2013  FINDINGS: The endotracheal tube tip lies 3.5 cm above the crotch of the carina. The left subclavian venous catheter tip lies in the region of the midportion of the SVC. The esophagogastric tube tip in proximal port project below the left hemidiaphragm. The cardiopericardial silhouette is normal in size. The pulmonary vascularity is not engorged. The mediastinum is normal in width. There is no pleural effusion or pneumothorax. The observed portions of the bony thorax appear normal.  IMPRESSION: 1. There is no evidence of pneumonia nor CHF nor other acute cardiopulmonary abnormality. 2. The support tubes and lines appear to be in reasonable position.   Electronically Signed   By: David  [place]   On: 10/13/2013 07:32   Dg Chest Port 1 View  10/12/2013   CLINICAL DATA:  Alcohol withdrawal, seizure activity, intubated  EXAM: PORTABLE CHEST - 1 VIEW  COMPARISON:  None.  FINDINGS: Endotracheal tube 3.4 cm above the carina. Left subclavian central line tip at the mid SVC level. Normal heart size. Lungs clear. No effusion or pneumothorax. No osseous abnormality.  IMPRESSION: Support apparatus in good position.  No acute chest process.   Electronically Signed   By: Ruel Favors M.D.   On: 10/12/2013 19:55   Dg Abd Portable 1v  10/12/2013   CLINICAL DATA:  Orogastric tube placement.  EXAM: PORTABLE ABDOMEN - 1 VIEW  COMPARISON:  10/12/2013 abdominal exam and chest x-ray.  FINDINGS: Feeding tube tip gastric fundus level.  Gas-filled nondilated stomach. Gas filled prominent size loops of small bowel and  colon.  The possibility of free intraperitoneal air cannot be assessed on a supine view.  Endotracheal tube tip 3.7 cm above the carina. Left central line tip mid superior vena cava level.  IMPRESSION: Feeding tube tip gastric fundus.  Please see above.   Electronically Signed   By: Bridgett Larsson M.D.   On: 10/12/2013 21:24   Dg Abd Portable 1v  10/12/2013   CLINICAL DATA:  New orogastric tube  EXAM: PORTABLE ABDOMEN - 1 VIEW  COMPARISON:  CT ABD/PELVIS W CM dated 02/18/2013; DG CHEST 1V PORT dated 10/12/2013  FINDINGS: Single supine view centered over the upper abdomen. Endotracheal and left-sided subclavian lines are unchanged. No orogastric or nasogastric tube is identified. Nonobstructive bowel gas pattern. Clear lungs.  IMPRESSION: No orogastric tube identified. Question whether this is in the oropharynx.   Electronically Signed   By: Jeronimo Greaves M.D.   On: 10/12/2013 20:52       ASSESSMENT / PLAN:  PULMONARY A: Acute respiratory failure due to severe AMS/agitation (RASS +4). S/p intubation 10/12/13  10/14/13: Does not meet SBT criteria due to agitation but improving P:   Vent settings established Vent bundle implemented Daily SBT as indicated See sedation changes  CARDIOVASCULAR A: Sinus Tachycardia- 2/2 Alcohol withdrawal     HTN- 2/2 Alcohol withdrawal P:  - Metoprolol- 2.5-5mg  Q3H PRN for HR >115. - Hydralazine- 10-40mg  Q4H PRN for Sys >170. - clonidine prn  RENAL A:  Mild rhabdo 10/14/13 and mild low mag  P:   DC diprivan Replete mag sulfate Monitor BMET intermittently Correct electrolytes as indicated  GASTROINTESTINAL A: On TF since 10/13/13  SUP: PPI  - Senokot 8.6mg  BID per NS  HEMATOLOGIC A:  Anemia of critical illness P:  DVT px: SCDs Monitor CBC intermittently PRBC forhgb <7gm%  INFECTIOUS A:  Leukocytosis- 14.8, Likely stress response P:   Micro and abx  as above  ENDOCRINE A:  Mild Hyperglycemia without hx of DM P:   - CBGs Q4H - Consider SSI  for blood sugars >180.  NEUROLOGIC A:  Acute Encephalopathy- 2/2 to alcohol withdrawal. (Urine tox also positive 10/11/13 for cocaine and marijuana)   Traumatic epidural hematoma- S/p Craniotomy and Evacuation   Delirium Tremens with severe agitation   Seizures  10/13/13: Hx reviewed. Features all c/w SEVERE DTs. High risk delirium candidate due to coccaine and MJ on board along with CNS injury Currently on diprivan and fent gtt  10/14/13: Neuro considering perioperative mania in ddx. Has high CK. On diprivan, fent and clonidine scheduled + ativan prn  P:   DC phnergan prn DC diprivan (CK 1000) DC fent gtt but use fent and ativan prn Start scheduled haldol Continue scheduled clonidine Start precedex If fails, use fent gtt Cont Phenytoin per NS Post op management per neurosurgery. - Seizure precautions.  TODAY'S SUMMARY: Patine with sevee enceophalopathy. Cannot be extubated 10/14/13. Made sedatin changes; hopefully this wil allow extubation next 1-2 days.  No family at bedside       The patient is critically ill with multiple organ systems failure and requires high complexity decision making for assessment and support, frequent evaluation and titration of therapies, application of advanced monitoring technologies and extensive interpretation of multiple databases.   Critical Care Time devoted to patient care services described in this note is  35  Minutes.  Dr. Kalman Shan, M.D., [facility].C.P Pulmonary and Critical Care Medicine Staff Physician [facility] Pager: [phone], If no answer or between  15:00h - 7:00h: call [phone]  10/14/2013 10:43 AM

## 2013-10-14 NOTE — Progress Notes (Signed)
Patient remains sedated and intubated on ventilator. He will awaken when sedation lifted. Still restless and agitated. Moving all 4 extremities equally. Pupils remained 2 mm and symmetric. Wound clean and dry.  Perioperative mania I believe most likely secondary to decompensated mental illness with perhaps some secondary substance abuse withdrawal. Sedation required for adequate patient control. Hopefully sedation can be lifted off today set at patient may be extubated. I began phenobarbital yesterday in hopes of providing better sedation and long-term treatment for his seizure disorder and a cost-effective way.

## 2013-10-14 NOTE — Progress Notes (Addendum)
CRITICAL VALUE ALERT  Critical value received: CK MB 11.8   Date of notification:  10/14/2013    Time of notification:  733   Critical value read back:yes  Nurse who received alert:  Juleen Starraryn Braydin Aloi, RN  MD notified (1st page):  Dr. Jordan LikesPool   Time of first page:  735 (on unit, told in person)  MD notified (2nd page):  Time of second page:  Responding MD:  Dr. Jordan LikesPool  Time MD responded:  735 (Asked to pass along to CCM as well.)  ** Passed along to Dr. Tyson AliasFeinstein as well. Orders received for troponins and 12 lead ekg

## 2013-10-15 ENCOUNTER — Encounter (HOSPITAL_COMMUNITY): Payer: Self-pay | Admitting: Neurosurgery

## 2013-10-15 ENCOUNTER — Inpatient Hospital Stay (HOSPITAL_COMMUNITY): Payer: Self-pay

## 2013-10-15 LAB — BASIC METABOLIC PANEL
BUN: 7 mg/dL (ref 6–23)
CHLORIDE: 105 meq/L (ref 96–112)
CO2: 23 mEq/L (ref 19–32)
CREATININE: 0.52 mg/dL (ref 0.50–1.35)
Calcium: 7.8 mg/dL — ABNORMAL LOW (ref 8.4–10.5)
GFR calc Af Amer: >90 mL/min (ref >90)
GFR calc non Af Amer: >90 mL/min (ref >90)
GLUCOSE: 137 mg/dL — AB (ref 70–99)
POTASSIUM: 4 meq/L (ref 3.7–5.3)
Sodium: 137 mEq/L (ref 137–147)

## 2013-10-15 LAB — PHOSPHORUS: Phosphorus: 2.3 mg/dL (ref 2.3–4.6)

## 2013-10-15 LAB — GLUCOSE, CAPILLARY
GLUCOSE-CAPILLARY: 132 mg/dL — AB (ref 70–99)
GLUCOSE-CAPILLARY: 121 mg/dL — AB (ref 70–99)
GLUCOSE-CAPILLARY: 105 mg/dL — AB (ref 70–99)
Glucose-Capillary: 103 mg/dL — ABNORMAL HIGH (ref 70–99)
Glucose-Capillary: 124 mg/dL — ABNORMAL HIGH (ref 70–99)

## 2013-10-15 LAB — MAGNESIUM: Magnesium: 1.6 mg/dL (ref 1.5–2.5)

## 2013-10-15 LAB — CK: CK TOTAL: 888 U/L — AB (ref 7–232)

## 2013-10-15 MED ORDER — PANTOPRAZOLE SODIUM 40 MG PO PACK
40.0000 mg | PACK | Freq: Every day | ORAL | Status: DC
  Administered 2013-10-15: 40 mg
  Filled 2013-10-15 (×2): qty 20

## 2013-10-15 MED ORDER — DEXMEDETOMIDINE HCL IN NACL 200 MCG/50ML IV SOLN
0.4000 ug/kg/h | INTRAVENOUS | Status: DC
  Administered 2013-10-15: 0.7 ug/kg/h via INTRAVENOUS
  Administered 2013-10-15: 0.8 ug/kg/h via INTRAVENOUS
  Administered 2013-10-15: 0.4 ug/kg/h via INTRAVENOUS
  Administered 2013-10-15: 0.7 ug/kg/h via INTRAVENOUS
  Administered 2013-10-15: 0.9 ug/kg/h via INTRAVENOUS
  Administered 2013-10-16: 0.6 ug/kg/h via INTRAVENOUS
  Administered 2013-10-16: 0.4 ug/kg/h via INTRAVENOUS
  Filled 2013-10-15 (×6): qty 50

## 2013-10-15 MED ORDER — MAGNESIUM SULFATE 40 MG/ML IJ SOLN
2.0000 g | Freq: Once | INTRAMUSCULAR | Status: AC
  Administered 2013-10-15: 2 g via INTRAVENOUS
  Filled 2013-10-15: qty 50

## 2013-10-15 NOTE — Progress Notes (Signed)
eLink Physician-Brief Progress Note Patient Name: Jason Long DOB: 1986-04-08 MRN: [id number]  Date of Service  10/15/2013   HPI/Events of Note  Asked by RN to evaluate for extubation; currently calm but intermittently reaching for ET tube Case reviewed with Dr. Marchelle Gearingamaswamy who was at patient bedside minutes ago and remains in house in case of complication; he recommends extubation  eICU Interventions  Extubation order set written      MCQUAID, DOUGLAS 10/15/2013, 4:17 PM

## 2013-10-15 NOTE — Clinical Social Work Note (Signed)
Clinical Social Worker continuing to follow patient and family for support and substance use treatment options at discharge.  Patient remains intubated with possible plans for extubation late this afternoon.  CSW spoke with patient mother regarding the need for cost effective medications and treatment once patient able to participate.  CSW to follow up with patient and family to further discuss options once patient extubated.  CSW remains available for support and to facilitate patient discharge needs once medically stable.  Jason Long, KentucMacario GoldskyLCSW [phone]

## 2013-10-15 NOTE — ED Provider Notes (Signed)
Medical screening examination/treatment/procedure(s) were conducted as a shared visit with non-physician practitioner(s) and myself.  I personally evaluated the patient during the encounter.  EKG Interpretation    Date/Time:    Ventricular Rate:    PR Interval:    QRS Duration:   QT Interval:    QTC Calculation:   R Axis:     Text Interpretation:              Patient presents to the ER for evaluation of an injury after a fall. Patient does have a history of seizure disorder, there was reportedly a seizure as well. Patient was extremely anxious, complaining of headache, but otherwise awake, alert. He has a normal neurologic exam. Head CT, however, did show epidural hematoma. [facility] by [facility]surgery.  Gilda Creasehristopher J. Alayzha An, MD 10/15/13 2042

## 2013-10-15 NOTE — Progress Notes (Signed)
Overall stable. Still intubated for control of agitation. Pt readily awakens off sedation. Moving all 4 strongly; f/c bilat. Wd C/D/I.    I do not see the point in continued intubation and heavy sedation, but I will defer to CCM

## 2013-10-15 NOTE — Procedures (Signed)
Extubation Procedure Note  Patient Details:   Name: Jason Long DOB: May 15, 1986 MRN: [id number]   Airway Documentation:     Evaluation  O2 sats: stable throughout Complications: No apparent complications Patient did tolerate procedure well. Bilateral Breath Sounds: Clear Suctioning: Airway Yes  Ave Filterdkins, Nadalyn Deringer Williams 10/15/2013, 4:27 PM

## 2013-10-15 NOTE — Progress Notes (Signed)
Name: Jason Long MRN: [id number] DOB: 03/04/1986    ADMISSION DATE:  10/11/2013 CONSULTATION DATE:  10/12/2013 REFERRING MD :  Kathaleen Maser Pool. PRIMARY SERVICE: Neurosurgery  CHIEF COMPLAINT: Agitation  BRIEF PATIENT DESCRIPTION:    Pt was agitated, and later intubated and sedated and so a hx could not be obtained form him. Family was not present, Hx as per previous documentation. 28 Y O Male with PMH of Seziures and alcohol abuse. Presented yesterday- 10/11/2013, pt had a seizure, while at work, fell from a truck and hit is head on the floor. On arrival in the ED pt complained of headache, stating it was the worst he had experienced. He was diagnosed with seizures about 5 months ago, has had 4 seizures since, does not take valproic acid because it is too expensive.  Last alcohol intake, was the morning of presentation to the Ed, prior to seizure, little quantity according to patient. Per RN on 10/13/13: last drink was 10/09/13 per family and patient and on 10/12/13 prior to intubation: hallucinating severely, diaphoretic, RASS +5 (running around room, needed security), and CIWA 45. Received 8mg  ativan and 5mg  haldol + precedex gtt at very high dose 2.5 in the immediate preceding 12h prior to intubation  LINES / TUBES: ETT 1/20 >>  L [place] CVL 1/20 >>   CULTURES: Results for orders placed during the hospital encounter of 10/11/13  MRSA PCR SCREENING     Status: Abnormal   Collection Time    10/11/13  3:49 PM      Result Value Range Status   MRSA by PCR POSITIVE (*) NEGATIVE Final   Comment:            The GeneXpert MRSA Assay (FDA     approved for NASAL specimens     only), is one component of a     comprehensive MRSA colonization     surveillance program. It is not     intended to diagnose MRSA     infection nor to guide or     monitor treatment for     MRSA infections.     RESULT CALLED TO, READ BACK BY AND VERIFIED WITH:     J.JONES AT 1936 BY L.PITT 10/11/13      ANTIBIOTICS: Peri- Op- Iv cefazolin.   SIGNIFICANT EVENTS / STUDIES:  10/11/2013- Head Ct - Bilateral scalp hematomas 10/12/2013- Head Ct-  Interval increase in size of left posterior parietal epidural hematoma 10/12/2013- Left Parietal Epidural hematoma Evacuation. 10/13/13: Extremely agitated per RN intermittently with fent gtt 135 and diprivan 40. Moves all 4s but can follow commands 10/14/13: Very agitated again on WUA; maybe some better. Needing diprivan (CK 1000) and fent gtt to maintain calm. Neurosuirg considering perioperative mania in ddx.   SUBJECTIVE/OVERNIGHT/INTERVAL HX 10/15/13: Did not toleratd precedex rechallenge. Back on diprivan. CK some better at 800s. Still agitated n WUA off diprivan but following commands and says he preers extubation  VITAL SIGNS: Temp:  [97.4 F (36.3 C)-99.3 F (37.4 C)] 97.4 F (36.3 C) (01/23 0334) Pulse Rate:  [75-113] 86 (01/23 0900) Resp:  [15-27] 17 (01/23 0900) BP: (100-149)/(56-95) 112/70 mmHg (01/23 0911) SpO2:  [100 %] 100 % (01/23 0900) FiO2 (%):  [40 %] 40 % (01/23 0900) Weight:  [74.1 kg (163 lb 5.8 oz)] 74.1 kg (163 lb 5.8 oz) (01/23 0431) HEMODYNAMICS: CVP:  [9 mmHg-12 mmHg] 12 mmHg VENTILATOR SETTINGS: Vent Mode:  [-] CPAP;PSV FiO2 (%):  [40 %] 40 % Set  Rate:  [20 bmp] 20 bmp Vt Set:  [500 mL] 500 mL PEEP:  [5 cmH20] 5 cmH20 Pressure Support:  [5 cmH20] 5 cmH20 Plateau Pressure:  [13 cmH20-18 cmH20] 13 cmH20 INTAKE / OUTPUT: Intake/Output     01/22 0701 - 01/23 0700 01/23 0701 - 01/24 0700   P.O.     I.V. (mL/kg) 2902.6 (39.2) 261.6 (3.5)   NG/GT 1070 110   IV Piggyback 2050    Total Intake(mL/kg) 6022.6 (81.3) 371.6 (5)   Urine (mL/kg/hr) 2455 (1.4) 500 (2.1)   Total Output 2455 500   Net +3567.6 -128.4        Stool Occurrence 4 x 1 x     PHYSICAL EXAMINATION: General:  Sedated, ETT in place, appears as stated age. Looks critically ill Neuro:  Currently sedated with diprivan fent gtt. WUA gets very  agitated but follows commands HEENT: ~8cm surgical incision in Left parietal region, with metal sutures in place, Pupils constricted- 3mm, reactive to light. Cardiovascular: Tachycardic, regular rhythm, no murmurs, rubns of gallops. Lungs: On ventilator, Coarse breath sounds, present bilat. Abdomen:  Soft, no palpable masses or organomegaly, bowel sound present. Musculoskeletal:  No pedal edema, peripheral pulses- DP present. Skin:  No rash, warm, diaphoretic.  LABS: PULMONARY No results found for this basename: PHART, PCO2, PCO2ART, PO2, PO2ART, HCO3, TCO2, O2SAT,  in the last 168 hours  CBC  Recent Labs Lab 10/11/13 1330 10/12/13 0850 10/13/13 0505  HGB 16.4 13.2 10.9*  HCT 44.5 36.7* 31.3*  WBC 11.4* 14.8* 8.1  PLT 226 179 141*    COAGULATION No results found for this basename: INR,  in the last 168 hours  CARDIAC    Recent Labs Lab 10/14/13 0815  TROPONINI <0.30   No results found for this basename: PROBNP,  in the last 168 hours   CHEMISTRY  Recent Labs Lab 10/12/13 0330 10/12/13 0850 10/13/13 0505 10/14/13 0432 10/15/13 0455  NA 142 140 139 141 137  K 3.9 4.2 3.9 3.8 4.0  CL 98 103 104 109 105  CO2 26 22 22 20 23   GLUCOSE 135* 123* 75 107* 137*  BUN 12 13 10 12 7   CREATININE 0.72 0.72 0.71 0.64 0.52  CALCIUM 9.1 8.4 8.7 8.1* 7.8*  MG  --   --   --  1.8 1.6  PHOS  --   --   --  3.0 2.3   Estimated Creatinine Clearance: 142.8 ml/min (by C-G formula based on Cr of 0.52).   LIVER  Recent Labs Lab 10/12/13 0330  AST 44*  ALT 40  ALKPHOS 123*  BILITOT 0.7  PROT 7.6  ALBUMIN 4.3     INFECTIOUS  Recent Labs Lab 10/14/13 0433  LATICACIDVEN 0.7     ENDOCRINE CBG (last 3)   Recent Labs  10/14/13 2325 10/15/13 0336 10/15/13 0801  GLUCAP 119* 103* 132*         IMAGING x48h  Dg Chest Port 1 View  10/15/2013   CLINICAL DATA:  Hypoxia  EXAM: PORTABLE CHEST - 1 VIEW  COMPARISON:  October 14, 2013  FINDINGS: Endotracheal tube  tip is 2.5 cm above the carina. Nasogastric tube tip and side port are in the stomach. Central catheter tip is at the cavoatrial junction. No pneumothorax. There is subtle infiltrate in the left base. Elsewhere lungs are clear. Heart size and pulmonary vascularity are normal. No adenopathy. No bone lesions.  IMPRESSION: Tube and catheter positions as described. No pneumothorax. Subtle infiltrate left base. Lungs  otherwise are clear.   Electronically Signed   By: Bretta Bang M.D.   On: 10/15/2013 07:21   Dg Chest Port 1 View  10/14/2013   CLINICAL DATA:  Endotracheal tube.  EXAM: PORTABLE CHEST - 1 VIEW  COMPARISON:  One-view chest 10/13/2013  FINDINGS: The endotracheal tube terminates 4 cm above the chronic, in satisfactory position. A left subclavian line is in place. The NG tube courses off the inferior border of the film. Opacities over the right lung are in part external to the patient. There is now mild pulmonary vascular congestion.  IMPRESSION: 1. The support apparatus is stable. 2. Interval development of mild pulmonary vascular congestion.   Electronically Signed   By: Gennette Pac M.D.   On: 10/14/2013 07:44       ASSESSMENT / PLAN:  PULMONARY A: Acute respiratory failure due to severe AMS/agitation (RASS +4). S/p intubation 10/12/13  10/15/13: Does not meet SBT criteria due to agitation but improving. Getting close.  P:   Consider extubation later today with precedex Vent settings established Vent bundle implemented Daily SBT as indicated See sedation changes  CARDIOVASCULAR A: Sinus Tachycardia- 2/2 Alcohol withdrawal     HTN- 2/2 Alcohol withdrawal P:  - Metoprolol- 2.5-5mg  Q3H PRN for HR >115. - Hydralazine- 10-40mg  Q4H PRN for Sys >170. - clonidine prn  RENAL A:  Mild rhabdo 10/14/13 improving despite diprivan and mild low mag  P:   consinue normal saline Replete mag sulfate Monitor BMET intermittently Correct electrolytes as indicated Recheck  CK  GASTROINTESTINAL A: On TF since 10/13/13  SUP: PPI  - Senokot 8.6mg  BID per NS  HEMATOLOGIC A:  Anemia of critical illness P:  DVT px: SCDs Monitor CBC intermittently PRBC forhgb <7gm%  INFECTIOUS A:  Leukocytosis- 14.8, Likely stress response P:   Micro and abx as above  ENDOCRINE A:  Mild Hyperglycemia without hx of DM P:   - CBGs Q4H - Consider SSI for blood sugars >180.  NEUROLOGIC A:  Acute Encephalopathy- 2/2 to alcohol withdrawal. (Urine tox also positive 10/11/13 for cocaine and marijuana)   Traumatic epidural hematoma- S/p Craniotomy and Evacuation   Delirium Tremens with severe agitation   Seizures  10/14/13: Neuro considering perioperative mania in ddx. Has high CK. On diprivan, fent and clonidine scheduled + ativan prn  10/15/13: Still agitated but some better on diprivan + scheduled ativan + phenobarb + scheduled haldol + scheduled clonidine  P:   Continue above Aim to extubate under precedex cover later today; high risk for reintubation  TODAY'S SUMMARY: Patine with sevee enceophalopathy. No family at bedside   The patient is critically ill with multiple organ systems failure and requires high complexity decision making for assessment and support, frequent evaluation and titration of therapies, application of advanced monitoring technologies and extensive interpretation of multiple databases.   Critical Care Time devoted to patient care services described in this note is  35  Minutes.  Dr. Kalman Shan, M.D., [facility].C.P Pulmonary and Critical Care Medicine Staff Physician  System [place] [facility] Pager: [phone], If no answer or between  15:00h - 7:00h: call [phone]  10/15/2013 10:14 AM

## 2013-10-16 LAB — BASIC METABOLIC PANEL
BUN: 4 mg/dL — AB (ref 6–23)
CALCIUM: 8 mg/dL — AB (ref 8.4–10.5)
CHLORIDE: 102 meq/L (ref 96–112)
CO2: 25 mEq/L (ref 19–32)
Creatinine, Ser: 0.49 mg/dL — ABNORMAL LOW (ref 0.50–1.35)
GFR calc non Af Amer: >90 mL/min (ref >90)
Glucose, Bld: 91 mg/dL (ref 70–99)
Potassium: 4 mEq/L (ref 3.7–5.3)
Sodium: 137 mEq/L (ref 137–147)

## 2013-10-16 LAB — CK TOTAL AND CKMB (NOT AT ARMC)
CK TOTAL: 579 U/L — AB (ref 7–232)
CK, MB: 3.9 ng/mL (ref 0.3–4.0)
Relative Index: 0.7 (ref 0.0–2.5)

## 2013-10-16 LAB — MAGNESIUM: Magnesium: 1.6 mg/dL (ref 1.5–2.5)

## 2013-10-16 LAB — LACTIC ACID, PLASMA: Lactic Acid, Venous: 0.6 mmol/L (ref 0.5–2.2)

## 2013-10-16 LAB — GLUCOSE, CAPILLARY
GLUCOSE-CAPILLARY: 90 mg/dL (ref 70–99)
Glucose-Capillary: 90 mg/dL (ref 70–99)
Glucose-Capillary: 92 mg/dL (ref 70–99)

## 2013-10-16 LAB — PHOSPHORUS: Phosphorus: 3.2 mg/dL (ref 2.3–4.6)

## 2013-10-16 MED ORDER — PANTOPRAZOLE SODIUM 40 MG IV SOLR
40.0000 mg | INTRAVENOUS | Status: DC
Start: 2013-10-16 — End: 2013-10-18
  Administered 2013-10-16 – 2013-10-17 (×2): 40 mg via INTRAVENOUS
  Filled 2013-10-16 (×3): qty 40

## 2013-10-16 MED ORDER — OXYCODONE HCL 5 MG PO TABS
5.0000 mg | ORAL_TABLET | Freq: Four times a day (QID) | ORAL | Status: DC | PRN
  Administered 2013-10-16 – 2013-10-19 (×10): 5 mg via ORAL
  Filled 2013-10-16 (×10): qty 1

## 2013-10-16 MED ORDER — MENTHOL 3 MG MT LOZG
1.0000 | LOZENGE | OROMUCOSAL | Status: DC | PRN
  Filled 2013-10-16: qty 9

## 2013-10-16 MED ORDER — LORAZEPAM 2 MG/ML IJ SOLN
1.0000 mg | Freq: Four times a day (QID) | INTRAMUSCULAR | Status: DC
  Administered 2013-10-16 – 2013-10-18 (×7): 1 mg via INTRAVENOUS
  Filled 2013-10-16 (×7): qty 1

## 2013-10-16 MED ORDER — HALOPERIDOL LACTATE 5 MG/ML IJ SOLN: 5.0000 mg | INTRAMUSCULAR | Status: DC | PRN

## 2013-10-16 NOTE — Progress Notes (Signed)
Pt seen and examined. No issues overnight. Pt remains extubated, c/o mild HA. No other c/o.  EXAM: Temp:  [97.7 F (36.5 C)-98.9 F (37.2 C)] 98.1 F (36.7 C) (01/24 0414) Pulse Rate:  [72-87] 80 (01/24 0700) Resp:  [12-27] 20 (01/24 0700) BP: (107-143)/(67-96) 133/91 mmHg (01/24 0700) SpO2:  [96 %-100 %] 99 % (01/24 0700) FiO2 (%):  [40 %] 40 % (01/23 1600) Weight:  [71.8 kg (158 lb 4.6 oz)] 71.8 kg (158 lb 4.6 oz) (01/24 0437) Intake/Output     01/23 0701 - 01/24 0700 01/24 0701 - 01/25 0700   I.V. (mL/kg) 2808.8 (39.1)    NG/GT 110    IV Piggyback     Total Intake(mL/kg) 2918.8 (40.7)    Urine (mL/kg/hr) 4410 (2.6)    Total Output 4410     Net -1491.2          Stool Occurrence 1 x     Awake, alert, oriented to person, hospital, year CN grossly intact Good strength throughout Wound c/d/i  LABS: Lab Results  Component Value Date   CREATININE 0.49* 10/16/2013   BUN 4* 10/16/2013   NA 137 10/16/2013   K 4.0 10/16/2013   CL 102 10/16/2013   CO2 25 10/16/2013   Lab Results  Component Value Date   WBC 8.1 10/13/2013   HGB 10.9* 10/13/2013   HCT 31.3* 10/13/2013   MCV 96.9 10/13/2013   PLT 141* 10/13/2013    IMPRESSION: - 28 y.o. male s/p crani for left EDH, neurologically well, agitation controlled with current meds  PLAN: - Cont current mgmt, limit sedation to PRN for agitation, stop gtt sedatives. - Cont dilantin

## 2013-10-16 NOTE — Progress Notes (Signed)
Name: Jason Long MRN: [id number] DOB: [birth year]    ADMISSION DATE:  10/11/2013 CONSULTATION DATE:  10/12/2013 REFERRING MD :  Kathaleen Maser Pool. PRIMARY SERVICE: Neurosurgery  CHIEF COMPLAINT: Agitation  BRIEF PATIENT DESCRIPTION:    Pt was agitated, and later intubated and sedated and so a hx could not be obtained form him. Family was not present, Hx as per previous documentation. 28 Y O Male with PMH of Seziures and alcohol abuse. Presented yesterday- 10/11/2013, pt had a seizure, while at work, fell from a truck and hit is head on the floor. On arrival in the ED pt complained of headache, stating it was the worst he had experienced. He was diagnosed with seizures about 5 months ago, has had 4 seizures since, does not take valproic acid because it is too expensive.  Last alcohol intake, was the morning of presentation to the Ed, prior to seizure, little quantity according to patient. Per RN on 10/13/13: last drink was 10/09/13 per family and patient and on 10/12/13 prior to intubation: hallucinating severely, diaphoretic, RASS +5 (running around room, needed security), and CIWA 45. Received 8mg  ativan and 5mg  haldol + precedex gtt at very high dose 2.5 in the immediate preceding 12h prior to intubation  LINES / TUBES: ETT 1/20 >> 1/23 L [place] CVL 1/20 >>   CULTURES: MRSA screen 1/19 >> positive  ANTIBIOTICS: Peri- Op- Iv cefazolin.   SIGNIFICANT EVENTS / STUDIES:  10/11/2013- Head Ct - Bilateral scalp hematomas 10/12/2013- Head Ct-  Interval increase in size of left posterior parietal epidural hematoma 10/12/2013- Left Parietal Epidural hematoma Evacuation. 10/13/13: Extremely agitated per RN intermittently with fent gtt 135 and diprivan 40. Moves all 4s but can follow commands 10/14/13: Very agitated again on WUA; maybe some better. Needing diprivan (CK 1000) and fent gtt to maintain calm. Neurosuirg considering perioperative mania in ddx.   SUBJECTIVE/OVERNIGHT/INTERVAL HX Extubated 1/23  pm Still with some confusion but better No agitation reported  VITAL SIGNS: Temp:  [97.7 F (36.5 C)-98.9 F (37.2 C)] 98.1 F (36.7 C) (01/24 0414) Pulse Rate:  [72-106] 80 (01/24 0700) Resp:  [12-27] 20 (01/24 0700) BP: (107-143)/(67-96) 133/91 mmHg (01/24 0700) SpO2:  [96 %-100 %] 99 % (01/24 0700) FiO2 (%):  [40 %] 40 % (01/23 1600) Weight:  [71.8 kg (158 lb 4.6 oz)] 71.8 kg (158 lb 4.6 oz) (01/24 0437) HEMODYNAMICS: CVP:  [8 mmHg-11 mmHg] 10 mmHg VENTILATOR SETTINGS: Vent Mode:  [-] CPAP;PSV FiO2 (%):  [40 %] 40 % PEEP:  [5 cmH20] 5 cmH20 Pressure Support:  [5 cmH20] 5 cmH20 INTAKE / OUTPUT: Intake/Output     01/23 0701 - 01/24 0700 01/24 0701 - 01/25 0700   I.V. (mL/kg) 2808.8 (39.1)    NG/GT 110    IV Piggyback     Total Intake(mL/kg) 2918.8 (40.7)    Urine (mL/kg/hr) 4410 (2.6)    Total Output 4410     Net -1491.2          Stool Occurrence 1 x      PHYSICAL EXAMINATION: General:  Sedated, ETT in place, appears as stated age. Looks critically ill Neuro:  Currently sedated with diprivan fent gtt. WUA gets very agitated but follows commands HEENT: ~8cm surgical incision in Left parietal region, with metal sutures in place, Pupils constricted- 3mm, reactive to light. Cardiovascular: Tachycardic, regular rhythm, no murmurs, rubns of gallops. Lungs: On ventilator, Coarse breath sounds, present bilat. Abdomen:  Soft, no palpable masses or organomegaly, bowel sound present.  Musculoskeletal:  No pedal edema, peripheral pulses- DP present. Skin:  No rash, warm, diaphoretic.  LABS: PULMONARY No results found for this basename: PHART, PCO2, PCO2ART, PO2, PO2ART, HCO3, TCO2, O2SAT,  in the last 168 hours  CBC  Recent Labs Lab 10/11/13 1330 10/12/13 0850 10/13/13 0505  HGB 16.4 13.2 10.9*  HCT 44.5 36.7* 31.3*  WBC 11.4* 14.8* 8.1  PLT 226 179 141*    COAGULATION No results found for this basename: INR,  in the last 168 hours  CARDIAC    Recent Labs Lab  10/14/13 0815  TROPONINI <0.30   No results found for this basename: PROBNP,  in the last 168 hours   CHEMISTRY  Recent Labs Lab 10/12/13 0850 10/13/13 0505 10/14/13 0432 10/15/13 0455 10/16/13 0451  NA 140 139 141 137 137  K 4.2 3.9 3.8 4.0 4.0  CL 103 104 109 105 102  CO2 22 22 20 23 25   GLUCOSE 123* 75 107* 137* 91  BUN 13 10 12 7  4*  CREATININE 0.72 0.71 0.64 0.52 0.49*  CALCIUM 8.4 8.7 8.1* 7.8* 8.0*  MG  --   --  1.8 1.6 1.6  PHOS  --   --  3.0 2.3 3.2   Estimated Creatinine Clearance: 138.4 ml/min (by C-G formula based on Cr of 0.49).   LIVER  Recent Labs Lab 10/12/13 0330  AST 44*  ALT 40  ALKPHOS 123*  BILITOT 0.7  PROT 7.6  ALBUMIN 4.3     INFECTIOUS  Recent Labs Lab 10/14/13 0433 10/16/13 0451  LATICACIDVEN 0.7 0.6     ENDOCRINE CBG (last 3)   Recent Labs  10/15/13 2053 10/15/13 2356 10/16/13 0411  GLUCAP 105* 92 90     IMAGING x48h  Dg Chest Port 1 View  10/15/2013   CLINICAL DATA:  Hypoxia  EXAM: PORTABLE CHEST - 1 VIEW  COMPARISON:  October 14, 2013  FINDINGS: Endotracheal tube tip is 2.5 cm above the carina. Nasogastric tube tip and side port are in the stomach. Central catheter tip is at the cavoatrial junction. No pneumothorax. There is subtle infiltrate in the left base. Elsewhere lungs are clear. Heart size and pulmonary vascularity are normal. No adenopathy. No bone lesions.  IMPRESSION: Tube and catheter positions as described. No pneumothorax. Subtle infiltrate left base. Lungs otherwise are clear.   Electronically Signed   By: Bretta Bang M.D.   On: 10/15/2013 07:21       ASSESSMENT / PLAN:  PULMONARY A: Acute respiratory failure due to severe AMS/agitation (RASS +4). S/p intubation 10/12/13, extubated 1/23 P:   - Push pulm hygiene  CARDIOVASCULAR A: Sinus Tachycardia- 2/2 Alcohol withdrawal     HTN- 2/2 Alcohol withdrawal P:  - Metoprolol- 2.5-5mg  Q3H PRN for HR >115. - Hydralazine- 10-40mg  Q4H PRN for  Sys >170. - clonidine prn  RENAL A:  Mild rhabdo 10/14/13 improving P:   consinue normal saline Replete mag sulfate Monitor BMET intermittently  GASTROINTESTINAL A: dysphagia  P: SUP: PPI  Senokot 8.6mg  BID per NS Swallow eval  HEMATOLOGIC A:  Anemia of critical illness P:  DVT px: SCDs Monitor CBC intermittently PRBC for hgb <7gm%  INFECTIOUS A:  Leukocytosis P:   Micro and abx as above Recheck CBC 1/25  ENDOCRINE A:  Mild Hyperglycemia without hx of DM P:   - CBGs Q4H - Consider SSI for blood sugars >180.  NEUROLOGIC A:  Acute Encephalopathy- 2/2 to alcohol withdrawal. (Urine tox also positive 10/11/13 for cocaine and  marijuana). Neuro considering perioperative mania in ddx   Traumatic epidural hematoma- S/p Craniotomy and Evacuation   Delirium Tremens with severe agitation   Seizures P:   Decrease freq scheduled ativan to q6h Change haldol to prn Try to wean off precedex Phenobarb + dilantin  TODAY'S SUMMARY: Multifactorial encephalopathy. Weaning precedex and ativan as he can tolerate.    The patient is critically ill with multiple organ systems failure and requires high complexity decision making for assessment and support, frequent evaluation and titration of therapies, application of advanced monitoring technologies and extensive interpretation of multiple databases.   Levy Pupaobert Ahnna Dungan, MD, PhD 10/16/2013, 9:55 AM [facility] [phone] or if no answer [phone]

## 2013-10-16 NOTE — Evaluation (Addendum)
Clinical/Bedside Swallow Evaluation Patient Details  Name: Jason MessingWesley A Novakowski MRN: [id number] Date of Birth: 15-Dec-1985  Today's Date: 10/16/2013 Time: 1218-1250 SLP Time Calculation (min): 32 min  Past Medical History:  Past Medical History  Diagnosis Date  . Seizure   . Traumatic epidural hematoma    Past Surgical History:  Past Surgical History  Procedure Laterality Date  . Dental surgery    . Craniotomy Left 10/12/2013    Procedure: CRANIOTOMY HEMATOMA EVACUATION EPIDURAL;  Surgeon: Temple PaciniHenry A Pool, MD;  Location: [facility];  Service: Neurosurgery;  Laterality: Left;   HPI:  28 year old male with a history of a generalized seizure disorder who has been noncompliant with his valproic acid suffered an acute generalized seizure and subsequent fall striking his head onto concrete. Patient awakened shortly after the seizure. He had a brief post ictal interlude but returned to full consciousness without obvious deficit.  Workup in the emergency department demonstrates a left parietal extra-axial hematoma either a posterior epidural hemorrhage or a loculated subdural hemorrhage.  Repeat CT next day revealed Interval increase in size of left posterior parietal epidural hematoma, now measuring 1.9 cm in maximal thickness. Thin extension of epidural hematoma seen more inferiorly, overlying the left occipital lobe, measuring 6 mm in thickness. Minimal associated vasogenic edema noted, reflecting mass effect. Suggestion of 3-4 mm of rightward midline shift posteriorly, without evidence of subfalcine herniation.  Pt. Underwent craniotomy 1/20. CXR 1/22 revealed subtle infiltrate left base. Lungs otherwise are clear.   Assessment / Plan / Recommendation Clinical Impression  Pt. appears to have an acute reversable pharyngeal dysphagia likely due to 4 day intubation.  Suspected laryngeal/pharyngeal edema/irritation possibly resulting in poor airway protection and residue evidenced by immediate and consistent  cough/throat clear following thin liquid that was significantly decreased with nectar thick liquids.  Solid textures resulted in throat clears and difficulty managing particulate textures.  SLP recommends short term nectar thick liquids, Dys 2 diet texture, no straws, pills whole in applesauce unless pill is large.  Pt. required mod-max verbal/visual/tactile cues to slow rate and consume small bite/sips.  He exhibits impulsivity and mild cognitive deficits with TBI (exacerbated by sedating meds) and will require full supervision and assist with meals.  Pt. Would greatly benefit from PT/OT assessments with UE weakness observed, visual disturbances, balance etc. For functional recovery from TBI.  Please order if agree.    Aspiration Risk  Moderate    Diet Recommendation Dysphagia 2 (Fine chop);Nectar-thick liquid   Liquid Administration via: Cup;No straw Medication Administration: Whole meds with puree Supervision: Patient able to self feed;Full supervision/cueing for compensatory strategies Compensations: Slow rate;Small sips/bites Postural Changes and/or Swallow Maneuvers: Seated upright 90 degrees    Other  Recommendations Oral Care Recommendations: Oral care BID   Follow Up Recommendations   (TBD)    Frequency and Duration min 2x/week  2 weeks   Pertinent Vitals/Pain WDL         Swallow Study         Oral/Motor/Sensory Function Overall Oral Motor/Sensory Function: Appears within functional limits for tasks assessed   Ice Chips Ice chips: Impaired Presentation: Self Fed Oral Phase Impairments: Other (comment);Reduced labial seal (general weakness/decreased endurance) Oral Phase Functional Implications: Prolonged oral transit;Left anterior spillage (mild) Pharyngeal Phase Impairments: Suspected delayed Swallow;Throat Clearing - Delayed   Thin Liquid Thin Liquid: Impaired Presentation: Cup Pharyngeal  Phase Impairments: Cough - Immediate;Suspected delayed Swallow;Multiple  swallows;Throat Clearing - Delayed    Nectar Thick Nectar Thick Liquid: Impaired Presentation: Cup  Pharyngeal Phase Impairments: Throat Clearing - Delayed (x 1)   Honey Thick Honey Thick Liquid: Not tested   Puree Puree: Within functional limits   Solid   GO    Solid: Impaired Oral Phase Impairments: Impaired anterior to posterior transit Oral Phase Functional Implications:  (delayed oral prep and transit) Pharyngeal Phase Impairments: Throat Clearing - Immediate       Royce Macadamia M.Ed [employer] [phone]  10/16/2013

## 2013-10-17 LAB — CBC
HCT: 33.6 % — ABNORMAL LOW (ref 39.0–52.0)
Hemoglobin: 12.2 g/dL — ABNORMAL LOW (ref 13.0–17.0)
MCH: 34 pg (ref 26.0–34.0)
MCHC: 36.3 g/dL — ABNORMAL HIGH (ref 30.0–36.0)
MCV: 93.6 fL (ref 78.0–100.0)
PLATELETS: 280 10*3/uL (ref 150–400)
RBC: 3.59 MIL/uL — ABNORMAL LOW (ref 4.22–5.81)
RDW: 13.1 % (ref 11.5–15.5)
WBC: 8.8 10*3/uL (ref 4.0–10.5)

## 2013-10-17 LAB — BASIC METABOLIC PANEL
BUN: 5 mg/dL — ABNORMAL LOW (ref 6–23)
CALCIUM: 8.6 mg/dL (ref 8.4–10.5)
CO2: 26 mEq/L (ref 19–32)
CREATININE: 0.53 mg/dL (ref 0.50–1.35)
Chloride: 100 mEq/L (ref 96–112)
GFR calc Af Amer: >90 mL/min (ref >90)
GFR calc non Af Amer: >90 mL/min (ref >90)
Glucose, Bld: 103 mg/dL — ABNORMAL HIGH (ref 70–99)
Potassium: 3.8 mEq/L (ref 3.7–5.3)
Sodium: 138 mEq/L (ref 137–147)

## 2013-10-17 LAB — MAGNESIUM: MAGNESIUM: 1.5 mg/dL (ref 1.5–2.5)

## 2013-10-17 LAB — PHENYTOIN LEVEL, TOTAL: Phenytoin Lvl: 7.7 ug/mL — ABNORMAL LOW (ref 10.0–20.0)

## 2013-10-17 LAB — PHOSPHORUS: Phosphorus: 3.3 mg/dL (ref 2.3–4.6)

## 2013-10-17 NOTE — Progress Notes (Signed)
Pt seen and examined. No issues overnight. Much more awake, appropriate this am. C/o some HA, tolerable with pain medication. No other c/o.  EXAM: Temp:  [98.2 F (36.8 C)-98.9 F (37.2 C)] 98.9 F (37.2 C) (01/25 0400) Pulse Rate:  [86-114] 93 (01/25 0800) Resp:  [15-32] 20 (01/25 0800) BP: (117-153)/(69-111) 136/99 mmHg (01/25 0800) SpO2:  [94 %-99 %] 98 % (01/25 0800) Weight:  [69.5 kg (153 lb 3.5 oz)] 69.5 kg (153 lb 3.5 oz) (01/25 0400) Intake/Output     01/24 0701 - 01/25 0700 01/25 0701 - 01/26 0700   P.O. 630    I.V. (mL/kg) 2400 (34.5) 100 (1.4)   NG/GT     Total Intake(mL/kg) 3030 (43.6) 100 (1.4)   Urine (mL/kg/hr) 6195 (3.7)    Total Output 6195     Net -3165 +100         Awake, alert, oriented to person, hospital, year CN grossly intact Good strength throughout Wound c/d/i  LABS: Lab Results  Component Value Date   CREATININE 0.53 10/17/2013   BUN 5* 10/17/2013   NA 138 10/17/2013   K 3.8 10/17/2013   CL 100 10/17/2013   CO2 26 10/17/2013   Lab Results  Component Value Date   WBC 8.8 10/17/2013   HGB 12.2* 10/17/2013   HCT 33.6* 10/17/2013   MCV 93.6 10/17/2013   PLT 280 10/17/2013    IMPRESSION: - 28 y.o. male s/p crani for left EDH, neurologically well, agitation and cooperation greatly improved.  PLAN: - Cont current mgmt, limit sedation to PRN for agitation, wean ativan - Check PHT level - PT/OT eval

## 2013-10-17 NOTE — Progress Notes (Signed)
Name: Jason Long MRN: [id number] DOB: [birth year]    ADMISSION DATE:  10/11/2013 CONSULTATION DATE:  10/12/2013 REFERRING MD :  Kathaleen Maser Pool. PRIMARY SERVICE: Neurosurgery  CHIEF COMPLAINT: Agitation  BRIEF PATIENT DESCRIPTION:    Pt was agitated, and later intubated and sedated and so a hx could not be obtained form him. Family was not present, Hx as per previous documentation. 28 Y O Male with PMH of Seziures and alcohol abuse. Presented yesterday- 10/11/2013, pt had a seizure, while at work, fell from a truck and hit is head on the floor. On arrival in the ED pt complained of headache, stating it was the worst he had experienced. He was diagnosed with seizures about 5 months ago, has had 4 seizures since, does not take valproic acid because it is too expensive.  Last alcohol intake, was the morning of presentation to the Ed, prior to seizure, little quantity according to patient. Per RN on 10/13/13: last drink was 10/09/13 per family and patient and on 10/12/13 prior to intubation: hallucinating severely, diaphoretic, RASS +5 (running around room, needed security), and CIWA 45. Received 8mg  ativan and 5mg  haldol + precedex gtt at very high dose 2.5 in the immediate preceding 12h prior to intubation  LINES / TUBES: ETT 1/20 >> 1/23 L [place] CVL 1/20 >>   CULTURES: MRSA screen 1/19 >> positive  ANTIBIOTICS: Peri- Op- Iv cefazolin.   SIGNIFICANT EVENTS / STUDIES:  10/11/2013- Head Ct - Bilateral scalp hematomas 10/12/2013- Head Ct-  Interval increase in size of left posterior parietal epidural hematoma 10/12/2013- Left Parietal Epidural hematoma Evacuation. 10/13/13: Extremely agitated per RN intermittently with fent gtt 135 and diprivan 40. Moves all 4s but can follow commands 10/14/13: Very agitated again on WUA; maybe some better. Needing diprivan (CK 1000) and fent gtt to maintain calm. Neurosuirg considering perioperative mania in ddx.   SUBJECTIVE/OVERNIGHT/INTERVAL HX Much more  awake and appropriate today Has some HA  VITAL SIGNS: Temp:  [98.2 F (36.8 C)-98.9 F (37.2 C)] 98.9 F (37.2 C) (01/25 0400) Pulse Rate:  [86-114] 93 (01/25 0800) Resp:  [15-32] 20 (01/25 0800) BP: (117-153)/(69-111) 136/99 mmHg (01/25 0800) SpO2:  [94 %-99 %] 98 % (01/25 0800) Weight:  [69.5 kg (153 lb 3.5 oz)] 69.5 kg (153 lb 3.5 oz) (01/25 0400) HEMODYNAMICS:   VENTILATOR SETTINGS:   INTAKE / OUTPUT: Intake/Output     01/24 0701 - 01/25 0700 01/25 0701 - 01/26 0700   P.O. 630    I.V. (mL/kg) 2400 (34.5) 100 (1.4)   NG/GT     Total Intake(mL/kg) 3030 (43.6) 100 (1.4)   Urine (mL/kg/hr) 6195 (3.7)    Total Output 6195     Net -3165 +100          PHYSICAL EXAMINATION: General:  Awake, comfortable,  Neuro:  Appropriate, conversant, non-focal. No tremor or agitation HEENT: ~8cm surgical incision in Left parietal region, with metal sutures in place, Pupils reactive to light. Cardiovascular: regular rate, regular rhythm, no murmurs, rubns of gallops. Lungs: clear B Abdomen:  Soft, no palpable masses or organomegaly, bowel sound present. Musculoskeletal:  No pedal edema, peripheral pulses- DP present. Skin:  No rash, warm, diaphoretic.  LABS: PULMONARY No results found for this basename: PHART, PCO2, PCO2ART, PO2, PO2ART, HCO3, TCO2, O2SAT,  in the last 168 hours  CBC  Recent Labs Lab 10/12/13 0850 10/13/13 0505 10/17/13 0500  HGB 13.2 10.9* 12.2*  HCT 36.7* 31.3* 33.6*  WBC 14.8* 8.1 8.8  PLT  179 141* 280    COAGULATION No results found for this basename: INR,  in the last 168 hours  CARDIAC    Recent Labs Lab 10/14/13 0815  TROPONINI <0.30   No results found for this basename: PROBNP,  in the last 168 hours   CHEMISTRY  Recent Labs Lab 10/13/13 0505 10/14/13 0432 10/15/13 0455 10/16/13 0451 10/17/13 0500  NA 139 141 137 137 138  K 3.9 3.8 4.0 4.0 3.8  CL 104 109 105 102 100  CO2 22 20 23 25 26   GLUCOSE 75 107* 137* 91 103*  BUN 10 12  7  4* 5*  CREATININE 0.71 0.64 0.52 0.49* 0.53  CALCIUM 8.7 8.1* 7.8* 8.0* 8.6  MG  --  1.8 1.6 1.6 1.5  PHOS  --  3.0 2.3 3.2 3.3   Estimated Creatinine Clearance: 133.9 ml/min (by C-G formula based on Cr of 0.53).   LIVER  Recent Labs Lab 10/12/13 0330  AST 44*  ALT 40  ALKPHOS 123*  BILITOT 0.7  PROT 7.6  ALBUMIN 4.3     INFECTIOUS  Recent Labs Lab 10/14/13 0433 10/16/13 0451  LATICACIDVEN 0.7 0.6     ENDOCRINE CBG (last 3)   Recent Labs  10/15/13 2356 10/16/13 0411 10/16/13 0900  GLUCAP 92 90 90     IMAGING x48h  No results found.     ASSESSMENT / PLAN:  PULMONARY A: Acute respiratory failure due to severe AMS/agitation (RASS +4). S/p intubation 10/12/13, extubated 1/23 P:   - Push pulm hygiene  CARDIOVASCULAR A: Sinus Tachycardia- 2/2 Alcohol withdrawal, improved 1/24     HTN- 2/2 Alcohol withdrawal P:  - Metoprolol- 2.5-5mg  Q3H PRN for HR >115. Doubt he will require - Hydralazine- 10-40mg  Q4H PRN for Sys >170. - clonidine scheduled while withdrawing; may not need this as a chronic med  RENAL A:  Mild rhabdo 10/14/13 improving P:   consinue normal saline Replete mag sulfate Monitor BMET intermittently  GASTROINTESTINAL A: dysphagia > resolved P: SUP: PPI  Senokot 8.6mg  BID per NS PO diet initiated 1/25  HEMATOLOGIC A:  Anemia of critical illness P:  DVT px: SCDs Monitor CBC intermittently PRBC for hgb <7gm%  INFECTIOUS A:  Leukocytosis, resolved P:   Follow clinically   ENDOCRINE A:  Mild Hyperglycemia without hx of DM P:   - follow glucose on BMP  NEUROLOGIC A:  Acute Encephalopathy- 2/2 to alcohol withdrawal. (Urine tox also positive 10/11/13 for cocaine and marijuana). Neuro considering perioperative mania in ddx. Improved MS on 1/24   Traumatic epidural hematoma- S/p Craniotomy and Evacuation   Delirium Tremens with severe agitation > withdrawal is his most significant medical issue now, currently well  controlled   Seizures P:   Tolerated decrease freq scheduled ativan to q6h, will continue this and plan to wean further 1/26 if not requiring prn doses Haldol prn Phenobarb + dilantin Will need an alcohol treatment program when the hospitalization is completed  TODAY'S SUMMARY: Much improved. Plan slow wean ativan for hx DT's   The patient is critically ill with multiple organ systems failure and requires high complexity decision making for assessment and support, frequent evaluation and titration of therapies, application of advanced monitoring technologies and extensive interpretation of multiple databases.   Levy Pupaobert Marcos Ruelas, MD, PhD 10/17/2013, 9:19 AM [facility] [phone] or if no answer [phone]

## 2013-10-18 DIAGNOSIS — S065X9A Traumatic subdural hemorrhage with loss of consciousness of unspecified duration, initial encounter: Secondary | ICD-10-CM

## 2013-10-18 DIAGNOSIS — S065XAA Traumatic subdural hemorrhage with loss of consciousness status unknown, initial encounter: Secondary | ICD-10-CM

## 2013-10-18 MED ORDER — CHLORDIAZEPOXIDE HCL 5 MG PO CAPS
10.0000 mg | ORAL_CAPSULE | Freq: Four times a day (QID) | ORAL | Status: DC
  Administered 2013-10-18 – 2013-10-19 (×5): 10 mg via ORAL
  Filled 2013-10-18 (×5): qty 2
  Filled 2013-10-18: qty 1
  Filled 2013-10-18 (×4): qty 2

## 2013-10-18 MED ORDER — PANTOPRAZOLE SODIUM 40 MG PO TBEC
40.0000 mg | DELAYED_RELEASE_TABLET | Freq: Every day | ORAL | Status: DC
  Administered 2013-10-18: 40 mg via ORAL
  Filled 2013-10-18: qty 1

## 2013-10-18 NOTE — Evaluation (Signed)
Physical Therapy Evaluation Patient Details Name: Jason Long MRN: [id number] DOB: 05-27-1986 Today's Date: 10/18/2013 Time: 4782-9562 PT Time Calculation (min): 19 min  PT Assessment / Plan / Recommendation History of Present Illness  28 year old male with a history of a generalized seizure disorder who has been noncompliant with his valproic acid suffered an acute generalized seizure and subsequent fall striking his head onto concrete. Patient awakened shortly after the seizure. He had a brief post ictal interlude but returned to full consciousness without obvious deficit. No evidence of hemodynamic instability or hypoxia. Patient transferred to [facility] hospital for evaluation. Workup in the emergency department demonstrates a left parietal extra-axial hematoma either a posterior epidural hemorrhage or a loculated subdural hemorrhage. The hemorrhage is causing some mild local mass effect but no evidence of herniation or obvious increased intracranial pressure. The patient had no further seizures. He notes headaches but otherwise states he feels good.  Clinical Impression  Pt functioning near baseline with minimal balance deficits as demo'd by higher level balance assessment. Pt safe to d/c home with father once medically stable. Pt with no further acute skilled PT needs at this time. Acute PT signing off, please re-consult if needed in future.    PT Assessment  Patent does not need any further PT services    Follow Up Recommendations  No PT follow up;Supervision - Intermittent    Does the patient have the potential to tolerate intense rehabilitation      Barriers to Discharge        Equipment Recommendations  None recommended by PT    Recommendations for Other Services     Frequency      Precautions / Restrictions Precautions Precautions: None Restrictions Weight Bearing Restrictions: No   Pertinent Vitals/Pain Denies pain      Mobility  Bed Mobility Overal bed mobility:  Independent Transfers Overall transfer level: Independent Ambulation/Gait Ambulation/Gait assistance: Independent Ambulation Distance (Feet): 500 Feet Assistive device: None Gait Pattern/deviations: Step-through pattern;WFL(Within Functional Limits) General Gait Details: occasional LOB during higher level balance assessment but able to self correct Stairs: Yes Stairs assistance: Modified independent (Device/Increase time) Stair Management: One rail Right;Alternating pattern Number of Stairs: 12    Exercises     PT Diagnosis:    PT Problem List:   PT Treatment Interventions:       PT Goals(Current goals can be found in the care plan section) Acute Rehab PT Goals Patient Stated Goal: home PT Goal Formulation: No goals set, d/c therapy  Visit Information  Last PT Received On: 10/18/13 Assistance Needed: +1 History of Present Illness: 28 year old male with a history of a generalized seizure disorder who has been noncompliant with his valproic acid suffered an acute generalized seizure and subsequent fall striking his head onto concrete. Patient awakened shortly after the seizure. He had a brief post ictal interlude but returned to full consciousness without obvious deficit. No evidence of hemodynamic instability or hypoxia. Patient transferred to [facility] hospital for evaluation. Workup in the emergency department demonstrates a left parietal extra-axial hematoma either a posterior epidural hemorrhage or a loculated subdural hemorrhage. The hemorrhage is causing some mild local mass effect but no evidence of herniation or obvious increased intracranial pressure. The patient had no further seizures. He notes headaches but otherwise states he feels good.       Prior Functioning  Home Living Family/patient expects to be discharged to:: Private residence Living Arrangements: Parent Available Help at Discharge: Family;Available PRN/intermittently Type of Home: Apartment Home Access: Stairs  to enter Entrance Stairs-Number of Steps: 10 Entrance Stairs-Rails: Right Home Layout: One level Home Equipment: None Prior Function Level of Independence: Independent Comments: pt attends school, indep with driving. does have alcohol abuse problems Communication Communication: No difficulties Dominant Hand: Right    Cognition  Cognition Arousal/Alertness: Awake/alert Behavior During Therapy: WFL for tasks assessed/performed Overall Cognitive Status: Within Functional Limits for tasks assessed    Extremity/Trunk Assessment Upper Extremity Assessment Upper Extremity Assessment: Overall WFL for tasks assessed (pt with noted L shld weakness, pt reports having old injury) Lower Extremity Assessment Lower Extremity Assessment: Overall WFL for tasks assessed Cervical / Trunk Assessment Cervical / Trunk Assessment: Normal   Balance Balance High level balance activites: Braiding;Backward walking (tandem walking) High Level Balance Comments: pt min guard for all testing, pt with episodes of LOB however able to self correct Standardized Balance Assessment Standardized Balance Assessment : Dynamic Gait Index Dynamic Gait Index Level Surface: Normal Change in Gait Speed: Normal Gait with Horizontal Head Turns: Normal Gait with Vertical Head Turns: Mild Impairment Gait and Pivot Turn: Mild Impairment Step Over Obstacle: Normal Step Around Obstacles: Normal Steps: Mild Impairment Total Score: 21  End of Session PT - End of Session Equipment Utilized During Treatment: Gait belt Activity Tolerance: Patient tolerated treatment well Patient left: in bed Nurse Communication: Mobility status  GP     Marcene BrawnChadwell, Sharetta Ricchio Marie 10/18/2013, 10:11 AM Lewis ShockAshly Richey Doolittle, PT, DPT Pager #: [phone] Office #: [phone]

## 2013-10-18 NOTE — Progress Notes (Signed)
PULMONARY / CRITICAL CARE MEDICINE  Name: Jason MessingWesley A Shew MRN: [id number] DOB: 1985-10-22    ADMISSION DATE:  10/11/2013 CONSULTATION DATE:  10/12/2013  REFERRING MD :  Neurosurgery PRIMARY SERVICE: Neurosurgery  CHIEF COMPLAINT: Agitation  BRIEF PATIENT DESCRIPTION: 28 yo with seizures and alcohol abuse admitted 1/19 with seizures and head injury after falling off his truck and hitting his head. L parietal epidural hematoma s/p craniotomy. In alcoholic delirium.  LINES / TUBES: ETT 1/20 >>> 1/23 L [place] CVL 1/20 >>>   CULTURES: 1/19  MRSA >>> POSITIVE  ANTIBIOTICS:  SIGNIFICANT EVENTS / STUDIES:  1/19    Head CT >>> L posterior parietal epidural hematoma 1/20    Head CT >>> Interval increase in size of epidural hematoma 1/20    OR >>> Hematoma evaquation  INTERVAL HISTORY:  No complains.  No overnight issues.  VITAL SIGNS: Temp:  [98 F (36.7 C)-98.6 F (37 C)] 98.6 F (37 C) (01/26 0400) Pulse Rate:  [74-102] 85 (01/26 0700) Resp:  [12-25] 21 (01/26 0700) BP: (117-155)/(69-99) 126/84 mmHg (01/26 0700) SpO2:  [96 %-99 %] 98 % (01/26 0700) Weight:  [65.6 kg (144 lb 10 oz)] 65.6 kg (144 lb 10 oz) (01/26 0400)  HEMODYNAMICS:   VENTILATOR SETTINGS:   INTAKE / OUTPUT: Intake/Output     01/25 0701 - 01/26 0700 01/26 0701 - 01/27 0700   P.O. 840    I.V. (mL/kg) 640 (9.8)    Total Intake(mL/kg) 1480 (22.6)    Urine (mL/kg/hr) 2975 (1.9)    Total Output 2975     Net -1495          Urine Occurrence 2 x    Stool Occurrence 2 x      PHYSICAL EXAMINATION: General:  No acute distress Neuro:  Awake, alert, cooperative  HEENT: Craniotomy incision c/d/i Cardiovascular: Regular, no murmurs Lungs: Bilateral air entry without added sounds Abdomen:  Soft, bowel sounds present Musculoskeletal:  No edema Skin:  No rash  CBC  Recent Labs Lab 10/12/13 0850 10/13/13 0505 10/17/13 0500  WBC 14.8* 8.1 8.8  HGB 13.2 10.9* 12.2*  HCT 36.7* 31.3* 33.6*  PLT 179 141* 280    Coag's No results found for this basename: APTT, INR,  in the last 168 hours  BMET  Recent Labs Lab 10/15/13 0455 10/16/13 0451 10/17/13 0500  NA 137 137 138  K 4.0 4.0 3.8  CL 105 102 100  CO2 23 25 26   BUN 7 4* 5*  CREATININE 0.52 0.49* 0.53  GLUCOSE 137* 91 103*   Electrolytes  Recent Labs Lab 10/15/13 0455 10/16/13 0451 10/17/13 0500  CALCIUM 7.8* 8.0* 8.6  MG 1.6 1.6 1.5  PHOS 2.3 3.2 3.3   Sepsis Markers  Recent Labs Lab 10/14/13 0433 10/16/13 0451  LATICACIDVEN 0.7 0.6   ABG No results found for this basename: PHART, PCO2ART, PO2ART,  in the last 168 hours Liver Enzymes  Recent Labs Lab 10/12/13 0330  AST 44*  ALT 40  ALKPHOS 123*  BILITOT 0.7  ALBUMIN 4.3   Cardiac Enzymes  Recent Labs Lab 10/14/13 0815  TROPONINI <0.30   Glucose  Recent Labs Lab 10/15/13 1144 10/15/13 1620 10/15/13 2053 10/15/13 2356 10/16/13 0411 10/16/13 0900  GLUCAP 124* 121* 105* 92 90 90   CXR: None today  ASSESSMENT / PLAN:  PULMONARY A:  Resolved acute respiratory failure in setting of acute encephalopathy P:   Supplemental oxygen PRN Incentive spirometry   CARDIOVASCULAR A:  Tachycardia / hypertension in  setting of acute alcohol withdrawal P:  Metoprolol / Hydralazine PRN D/c Clonidine  RENAL A:   Resolved rhabdomyolysis P:   Trend BMP  GASTROINTESTINAL A:  Nutrition  GI Px P: Diet Protonix  HEMATOLOGIC A:   Mild anemia VTE Px P:  Trend CBC  INFECTIOUS A:   No active issues  P:   No intervention required  ENDOCRINE A:   Normoglycemia P:   No intervention required  NEUROLOGIC A:   Traumatic epidural hematoma s/p craniotomy and evacuation Alcohol withdrawal Cocaine / marijuana use Acute encephalopathy H/o seizures P:   Oxycodone PRN D/c Haldol D/c scheduled Ativan Ativan  PRN Start Librium Phenobarbital Dilantin  Appears to be stable for downgrade.  I have personally obtained history, examined  patient, evaluated and interpreted laboratory and imaging results, reviewed medical records, formulated assessment / plan and placed orders.  Lonia Farber, MD Pulmonary and Critical Care Medicine [facility] Pager: [phone]  10/18/2013, 8:28 AM

## 2013-10-18 NOTE — Progress Notes (Signed)
OT Cancellation Note and Discharge  Patient Details Name: Jason Long MRN: [id number] DOB: Sep 02, 1986   Cancelled Treatment:    Reason Eval/Treat Not Completed: OT screened, no needs identified, will sign off.  Talked to PT and saw pt ambulating with PT, no OT services needed, we will sign off.  Evette GeorgesLeonard, Cicely Ortner Eva [phone] 10/18/2013, 9:53 AM

## 2013-10-18 NOTE — Progress Notes (Signed)
Speech Language Pathology Treatment: Dysphagia  Patient Details Name: Jason Long MRN: [id number] DOB: April 19, 1986 Today's Date: 10/18/2013 Time: 0822-0832 SLP Time Calculation (min): 10 min  Assessment / Plan / Recommendation Clinical Impression  Pt. Has significantly improved alertness and awareness reporting he does not recall much before yesterday.  Observation of thin liquids and regular texture diet was completely functional without oropharyngeal difficulty.  Recommend upgrade diet to regular texture and thin liquids, straws ok and consume liquids with thin.  No follow up needed.            HPI HPI: 28 year old male with a history of a generalized seizure disorder who has been noncompliant with his valproic acid suffered an acute generalized seizure and subsequent fall striking his head onto concrete. Patient awakened shortly after the seizure. He had a brief post ictal interlude but returned to full consciousness without obvious deficit.  Workup in the emergency department demonstrates a left parietal extra-axial hematoma either a posterior epidural hemorrhage or a loculated subdural hemorrhage.  Repeat CT next day revealed Interval increase in size of left posterior parietal epidural hematoma, now measuring 1.9 cm in maximal thickness. Thin extension of epidural hematoma seen more inferiorly, overlying the left occipital lobe, measuring 6 mm in thickness. Minimal associated vasogenic edema noted, reflecting mass effect. Suggestion of 3-4 mm of rightward midline shift posteriorly, without evidence of subfalcine herniation.  Underwent craniotomy 1/20. CXR 1/22 revealed subtle infiltrate left base. Lungs otherwise are clear.   Pertinent Vitals WDL  SLP Plan  All goals met;Discharge SLP treatment due to (comment)    Recommendations Diet recommendations: Regular;Thin liquid Liquids provided via: Cup;Straw Medication Administration: Whole meds with liquid Supervision: Patient able to self  feed Compensations: Slow rate;Small sips/bites Postural Changes and/or Swallow Maneuvers: Seated upright 90 degrees              Oral Care Recommendations: Oral care BID Follow up Recommendations: None Plan: All goals met;Discharge SLP treatment due to (comment)    GO     Jason Long.Ed [employer] [phone]  10/18/2013

## 2013-10-18 NOTE — Progress Notes (Signed)
Patient looks much better. Less anxious. Completely oriented.  Afebrile. Vitals are stable. Awake and alert. Oriented and appropriate. Motor and sensory function intact. Wound clean and dry. Headache controlled.  Doing well. Transfer to floor. Probable discharge home tomorrow.

## 2013-10-18 NOTE — Progress Notes (Signed)
NUTRITION FOLLOW UP  Intervention:   Encourage intake at meals.   Nutrition Dx:   Inadequate oral intake related to inability to eat as evidenced by NPO status; resolved.  Goal:  Pt to meet >/= 90% of their estimated nutrition needs, met.   Monitor:  PO intake, labs, weight trend  Assessment:   Pt with recently dx with seizures and ETOH abuse. Pt had not been taking his seizure medication due to expense. Pt fell off truck while at work hitting his head on the floor.  Pt with traumatic SDH s/p craniotomy and evacuation.  Pt re-intubated due to acute encephalopathy due to alcohol withdrawal (Urine also positive for cocaine and marijuana). Pt with severe agitation and altered mental status prior to intubation.  Pt likely with poor nutrition quality PTA due to substance abuse history.   Pt extubated 1/23 with great improvement in cognition.  Per pt he had no nutrition issues PTA, now has good appetite and eating well.   Height: Ht Readings from Last 1 Encounters:  10/11/13 6' (1.829 m)    Weight Status:   Wt Readings from Last 1 Encounters:  10/18/13 144 lb 10 oz (65.6 kg)  Admission weight 142 lb (64.8 kg)  Re-estimated needs:  Kcal: 2000-2300 Protein: 80-95 grams Fluid: >2.1 L/day  Skin: left head incision   Diet Order: General Meal Completion: 100%   Intake/Output Summary (Last 24 hours) at 10/18/13 1248 Last data filed at 10/18/13 1200  Gross per 24 hour  Intake    840 ml  Output   2450 ml  Net  -1610 ml    Last BM: 1/26   Labs:   Recent Labs Lab 10/15/13 0455 10/16/13 0451 10/17/13 0500  NA 137 137 138  K 4.0 4.0 3.8  CL 105 102 100  CO2 23 25 26   BUN 7 4* 5*  CREATININE 0.52 0.49* 0.53  CALCIUM 7.8* 8.0* 8.6  MG 1.6 1.6 1.5  PHOS 2.3 3.2 3.3  GLUCOSE 137* 91 103*    CBG (last 3)   Recent Labs  10/15/13 2356 10/16/13 0411 10/16/13 0900  GLUCAP 92 90 90    Scheduled Meds: . antiseptic oral rinse  15 mL Mouth Rinse q12n4p  .  chlordiazePOXIDE  10 mg Oral QID  . chlorhexidine  15 mL Mouth Rinse BID  . multivitamin  5 mL Per Tube Daily  . pantoprazole  40 mg Oral Daily  . PHENObarbital  65 mg Intravenous BID  . phenytoin (DILANTIN) IV  100 mg Intravenous Q8H  . senna  1 tablet Per Tube BID    Continuous Infusions: . 0.9 % NaCl with KCl 20 mEq / L 20 mL/hr at 10/18/13 [place], [place], [place] Pager [phone] After Hours Pager

## 2013-10-18 NOTE — Clinical Social Work Note (Signed)
Clinical Social Worker continuing to follow patient and family for support and discharge planning needs.  CSW spoke with patient at bedside who has done remarkably well in the last 24-48 hours.  Patient was completing homework in the bed upon CSW arrival.  Patient states that the does not have recollection of the events that took place, but feels grateful for the support and to still be alive.  Patient does verbalize his understanding of his current substance abuse issues and potential mental health concerns.  Patient was interested in Psychiatry consult, however CSW was able to provide list of outside providers to assist with consult.  Patient has resources for residential, intensive outpatient, and outpatient resources.  Patient plans to continue to go to school and work on his substance abuse through counseling and outpatient resources.  Patient very appreciative of information and CSW support and involvement.  CSW to follow up with patient as deemed necessary throughout hospitalization.  Patient plans to return home with his father and return to school as soon as possible.  Jason Long, [place] [phone]

## 2013-10-19 MED ORDER — PHENOBARBITAL 60 MG PO TABS: 60.0000 mg | ORAL_TABLET | Freq: Two times a day (BID) | ORAL | Status: DC

## 2013-10-19 NOTE — Progress Notes (Signed)
Pt staples removed from head with no complications. Central line was removed and occlusive dressing applied. Discharge instructions were reviewed with the patient and his father. Pt was transported by wheelchair to his father's vehicle.

## 2013-10-19 NOTE — Progress Notes (Signed)
PULMONARY / CRITICAL CARE MEDICINE  Name: KAMERAN MCNEESE MRN: [id number] DOB: [birth year]    ADMISSION DATE:  10/11/2013 CONSULTATION DATE:  10/12/2013  REFERRING MD :  Neurosurgery PRIMARY SERVICE: Neurosurgery  CHIEF COMPLAINT: Agitation  BRIEF PATIENT DESCRIPTION: 28 yo with seizures and alcohol abuse admitted 1/19 with seizures and head injury after falling off his truck and hitting his head. L parietal epidural hematoma s/p craniotomy. In alcoholic delirium.  LINES / TUBES: ETT 1/20 >>> 1/23 L [place] CVL 1/20 >>>   CULTURES: 1/19  MRSA >>> POSITIVE  ANTIBIOTICS:  SIGNIFICANT EVENTS / STUDIES:  1/19    Head CT >>> L posterior parietal epidural hematoma 1/20    Head CT >>> Interval increase in size of epidural hematoma 1/20    OR >>> Hematoma evaquation  INTERVAL HISTORY:  Ambulating well this morning.  VITAL SIGNS: Temp:  [98.2 F (36.8 C)-98.5 F (36.9 C)] 98.4 F (36.9 C) (01/27 0341) Pulse Rate:  [106] 106 (01/26 0900) Resp:  [13-31] 30 (01/27 0600) BP: (115-156)/(61-101) 150/98 mmHg (01/27 0600) SpO2:  [98 %-99 %] 98 % (01/26 1700) Weight:  [64.6 kg (142 lb 6.7 oz)] 64.6 kg (142 lb 6.7 oz) (01/27 0500)  HEMODYNAMICS:   VENTILATOR SETTINGS:   INTAKE / OUTPUT: Intake/Output     01/26 0701 - 01/27 0700 01/27 0701 - 01/28 0700   P.O.     I.V. (mL/kg) 100 (1.5)    Total Intake(mL/kg) 100 (1.5)    Urine (mL/kg/hr) 1400 (0.9)    Total Output 1400     Net -1300          Urine Occurrence 2 x      PHYSICAL EXAMINATION: General:  No acute distress Neuro:  Awake, alert HEENT: Craniotomy incision c/d/i Cardiovascular: RRR Lungs: CTAB Abdomen:  Soft, bowel sounds present Musculoskeletal:  No edema Skin:  No rash  CBC  Recent Labs Lab 10/12/13 0850 10/13/13 0505 10/17/13 0500  WBC 14.8* 8.1 8.8  HGB 13.2 10.9* 12.2*  HCT 36.7* 31.3* 33.6*  PLT 179 141* 280   Coag's No results found for this basename: APTT, INR,  in the last 168  hours  BMET  Recent Labs Lab 10/15/13 0455 10/16/13 0451 10/17/13 0500  NA 137 137 138  K 4.0 4.0 3.8  CL 105 102 100  CO2 23 25 26   BUN 7 4* 5*  CREATININE 0.52 0.49* 0.53  GLUCOSE 137* 91 103*   Electrolytes  Recent Labs Lab 10/15/13 0455 10/16/13 0451 10/17/13 0500  CALCIUM 7.8* 8.0* 8.6  MG 1.6 1.6 1.5  PHOS 2.3 3.2 3.3   Sepsis Markers  Recent Labs Lab 10/14/13 0433 10/16/13 0451  LATICACIDVEN 0.7 0.6   ABG No results found for this basename: PHART, PCO2ART, PO2ART,  in the last 168 hours Liver Enzymes No results found for this basename: AST, ALT, ALKPHOS, BILITOT, ALBUMIN,  in the last 168 hours Cardiac Enzymes  Recent Labs Lab 10/14/13 0815  TROPONINI <0.30   Glucose  Recent Labs Lab 10/15/13 1144 10/15/13 1620 10/15/13 2053 10/15/13 2356 10/16/13 0411 10/16/13 0900  GLUCAP 124* 121* 105* 92 90 90   CXR: None today  ASSESSMENT / PLAN:  PULMONARY A:  Resolved acute respiratory failure in setting of acute encephalopathy P:   Supplemental oxygen PRN Incentive spirometry   CARDIOVASCULAR A:  Tachycardia / hypertension in setting of acute alcohol withdrawal - resolved P:  Metoprolol / Hydralazine PRN  RENAL A:   Resolved rhabdomyolysis P:   Trend  BMP  GASTROINTESTINAL A:  Nutrition  GI Px is not indicated  P: Diet D/c Protonix  HEMATOLOGIC A:   Mild anemia VTE Px P:  Trend CBC  INFECTIOUS A:   No active issues  P:   No intervention required  ENDOCRINE A:   Normoglycemia P:   No intervention required  NEUROLOGIC A:   Traumatic epidural hematoma s/p craniotomy and evacuation Alcohol withdrawal, resolved Cocaine / marijuana use Acute encephalopathy, resolved H/o seizures P:   Oxycodone PRN Ativan  PRN Librium Phenobarbital Dilantin  I have personally obtained history, examined patient, evaluated and interpreted laboratory and imaging results, reviewed medical records, formulated assessment / plan  and placed orders.  Lonia FarberZUBELEVITSKIY, Kambria Grima, MD Pulmonary and Critical Care Medicine [facility] Pager: [phone]  10/19/2013, 8:24 AM

## 2013-10-19 NOTE — Clinical Social Work Note (Signed)
Clinical Social Worker continuing to follow patient and family for support and discharge needs.  CSW spoke with patient at bedside who states he is hopeful for discharge home with his father today.  Patient was given resources yesterday for outpatient substance abuse and Psychiatry consult - he has [facility] contact [facility] and plans to initiate plans in [place] at discharge.  Patient appreciative of information and willing to participate in an outpatient program.  Patient verbalizes his understanding of relapse and the opportunity to begin a program at discharge.  Clinical Social Worker will sign off for now as social work intervention is no longer needed. Please consult us again if new need arises.  Macario GoldsJesse Frantz [place], [place] [phone]

## 2013-10-19 NOTE — Discharge Summary (Signed)
Physician Discharge Summary  Patient ID: Jason Long MRN: [id number] DOB/AGE: 10-13-1985 28 y.o.  Admit date: 10/11/2013 Discharge date: 10/19/2013  Admission Diagnoses:  Discharge Diagnoses:  Active Problems:   Acute subdural hematoma   Subdural hemorrhage following injury   Traumatic epidural hematoma   Acute respiratory failure   Sinus tachycardia   Hyperglycemia, mild   Encephalopathy acute   Alcohol withdrawal   Delirium tremens   Discharged Condition: good  Hospital Course: The patient is a and noted to the hospital for evaluation and treatment of a post traumatic left parietal epidural hematoma. Situation complicated by recent seizure secondary to medication non-compliance. Situation also complicated by alcohol abuse. Patient with worsening headache and worsening head CT scan. Taken to the operating room for craniotomy and evacuation of epidural hematoma. Postoperatively patient with difficulty with significant mood alteration possibly secondary.call withdrawal. Patient required high-dose IV sedation and eventually innovation 4 patient safety. He was kept intubated for approximately 4 days. His mood stabilized. He is able to be extubated and is currently doing well. Currently is having no headache. He denies any other neurologic symptoms. He shows no signs of new seizure or alcohol withdrawal at present. Patient has been switched from valproic acid 2 phenobarbital. Patient was noncompliant with his upper late secondary to its cost. Hopefully the phenobarbital will be more cost-effective and still provided seizure control. Patient be discharged home he'll follow up with me in 2 weeks.  Consults:   Significant Diagnostic Studies:   Treatments:   Discharge Exam: Blood pressure 141/87, pulse 106, temperature 98.3 F (36.8 C), temperature source Oral, resp. rate 25, height 6' (1.829 m), weight 64.6 kg (142 lb 6.7 oz), SpO2 98.00%. Awake and alert. Oriented and appropriate. Motor  and sensory function intact. Cranial nerve function intact. Wound clean and dry.  Disposition: 01-Home or Self Care     Medication List    STOP taking these medications       Valproic Acid 250 MG Cpdr      TAKE these medications       ibuprofen 800 MG tablet  Commonly known as:  ADVIL,MOTRIN  Take 800 mg by mouth every 8 (eight) hours as needed for moderate pain.     PHENObarbital 60 MG tablet  Commonly known as:  LUMINAL  Take 1 tablet (60 mg total) by mouth 2 (two) times daily.           Follow-up Information   Schedule an appointment as soon as possible for a visit with Temple PaciniPOOL,Manual Navarra A, MD.   Specialty:  Neurosurgery   Contact information:   [address] [place] [address] [phone]       Signed: Julio SicksPOOL,Seng Larch A 10/19/2013, 2:42 PM

## 2013-10-20 LAB — PHENYTOIN LEVEL, FREE AND TOTAL
PHENYTOIN FREE: 0.8 mg/L — AB (ref 1.0–2.0)
Phenytoin Bound: 5.7 mg/L
Phenytoin, Total: 6.5 mg/L — ABNORMAL LOW (ref 10.0–20.0)

## 2014-01-05 ENCOUNTER — Encounter (HOSPITAL_COMMUNITY): Payer: Self-pay | Admitting: Neurosurgery

## 2015-04-27 ENCOUNTER — Emergency Department (HOSPITAL_BASED_OUTPATIENT_CLINIC_OR_DEPARTMENT_OTHER)
Admission: EM | Admit: 2015-04-27 | Discharge: 2015-04-27 | Discharge status: Against Medical Advice | Payer: Self-pay | Attending: Emergency Medicine | Admitting: Emergency Medicine

## 2015-04-27 ENCOUNTER — Encounter (HOSPITAL_BASED_OUTPATIENT_CLINIC_OR_DEPARTMENT_OTHER): Payer: Self-pay

## 2015-04-27 DIAGNOSIS — F1110 Opioid abuse, uncomplicated: Secondary | ICD-10-CM | POA: Insufficient documentation

## 2015-04-27 DIAGNOSIS — Z720 Tobacco use: Secondary | ICD-10-CM | POA: Insufficient documentation

## 2015-04-27 DIAGNOSIS — F1010 Alcohol abuse, uncomplicated: Secondary | ICD-10-CM | POA: Insufficient documentation

## 2015-04-27 DIAGNOSIS — F1410 Cocaine abuse, uncomplicated: Secondary | ICD-10-CM | POA: Insufficient documentation

## 2015-04-27 HISTORY — DX: Cocaine abuse, uncomplicated: F14.10

## 2015-04-27 HISTORY — DX: Alcohol abuse, uncomplicated: F10.10

## 2015-04-27 HISTORY — DX: Opioid abuse, uncomplicated: F11.10

## 2015-04-27 NOTE — ED Notes (Signed)
Pt requesting detox assit from ETOH, cocaine and opiates-last rehab Sept 2015 at [facility]-last ETOH 6 hours ago, cocaine and opiates last used 2 days ago-pt NAD-cooperative-steady gait-states he was seen today at [facility] and denied access to their behavior health program "because i've been there too many times"

## 2015-04-27 NOTE — ED Notes (Signed)
Registration reports that pt left .

## 2015-08-10 ENCOUNTER — Other Ambulatory Visit: Payer: Self-pay | Admitting: Neurosurgery

## 2015-08-10 DIAGNOSIS — S064X9S Epidural hemorrhage with loss of consciousness of unspecified duration, sequela: Secondary | ICD-10-CM

## 2015-08-16 ENCOUNTER — Ambulatory Visit
Admission: RE | Admit: 2015-08-16 | Discharge: 2015-08-16 | Disposition: A | Discharge status: Home / Self Care | Payer: 59 | Source: Ambulatory Visit | Attending: Neurosurgery | Admitting: Neurosurgery

## 2015-08-16 DIAGNOSIS — S064X9S Epidural hemorrhage with loss of consciousness of unspecified duration, sequela: Secondary | ICD-10-CM

## 2017-07-29 IMAGING — CT CT HEAD W/O CM
3 of 4 series · 18 of 30 positions shown, 19 images · non-contrast
Comparison: CT head without contrast 10/12/2014.

CLINICAL DATA: Personal history of epidural hematoma in loss of
consciousness. Patient underwent surgery. New onset headaches. The
patient states it feels like the craniotomy flap is moving.

EXAM:
CT HEAD WITHOUT CONTRAST
TECHNIQUE: Contiguous axial images were obtained from the base of the skull
through the vertex without intravenous contrast.

[Series 3: head w/o · axial · non-contrast · 0.49mm/px · z∈[-17,+143]mm · 3 of 33 slices shown, 4 images]
[im 1/33  brain]
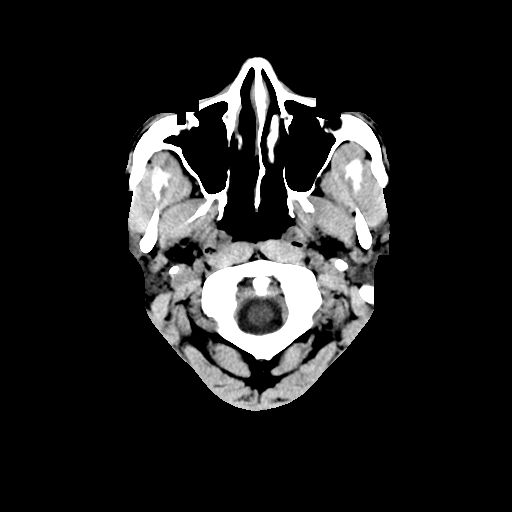
[im 1/33  bone]
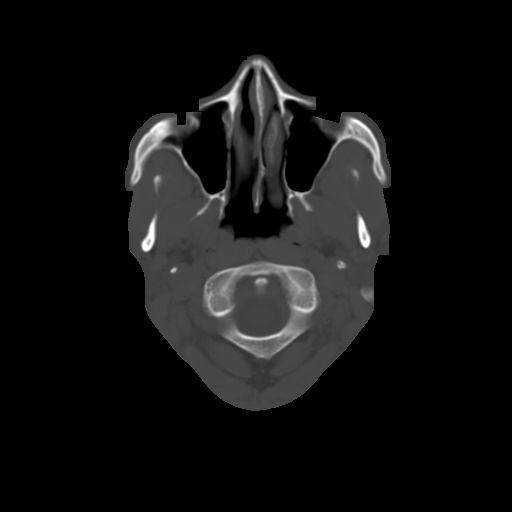
[im 17/33  brain]
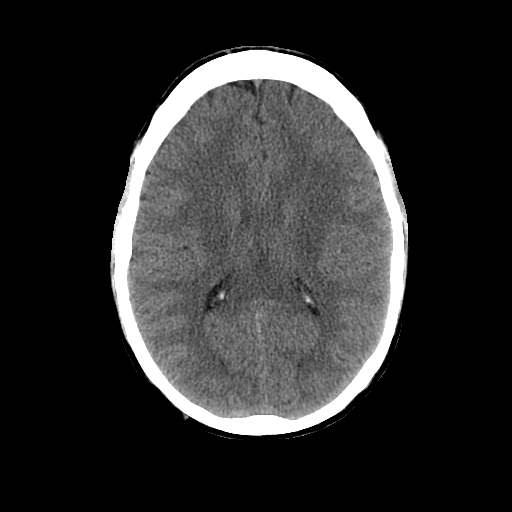
[im 33/33  brain]
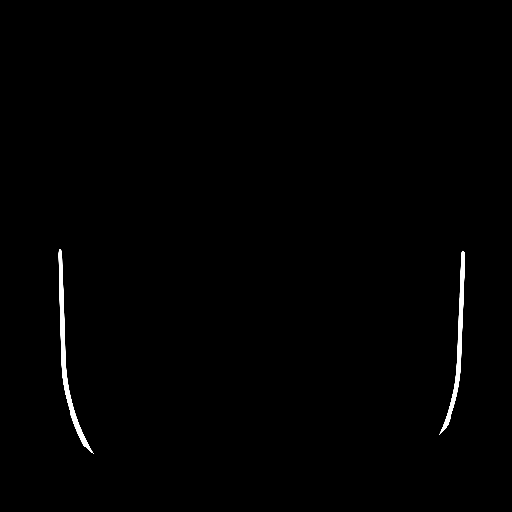

[Series 601: cor brain venogram · coronal · 0.49mm/px · 8 of 103 slices shown]
[im 10/103  brain]
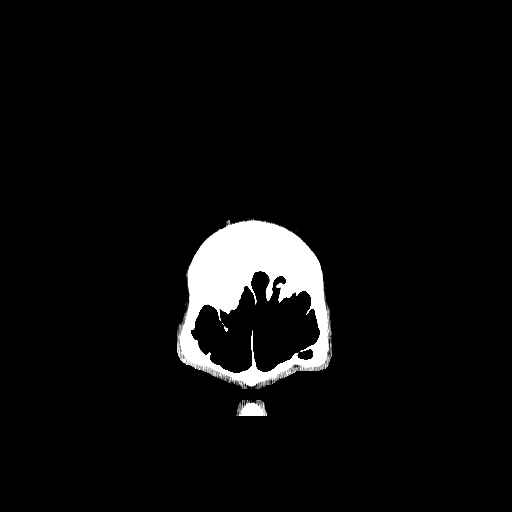
[im 19/103  brain]
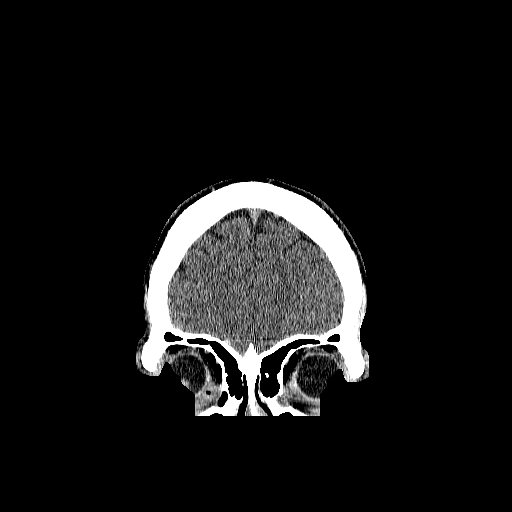
[im 38/103  brain]
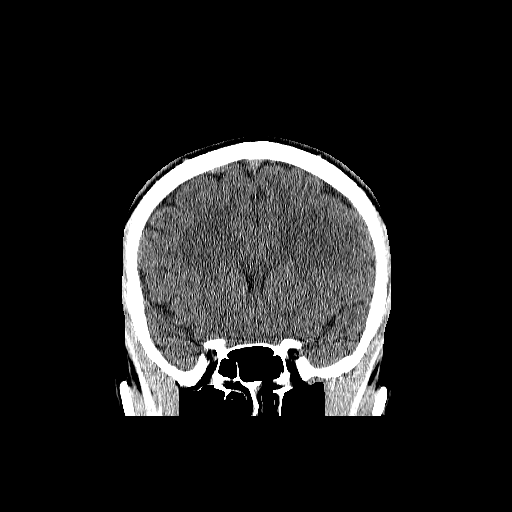
[im 47/103  brain]
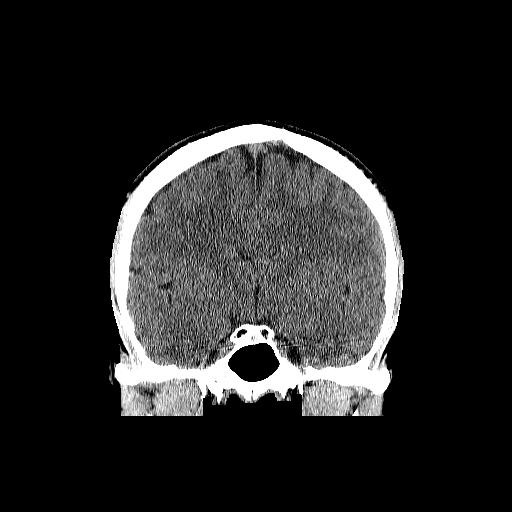
[im 56/103  brain]
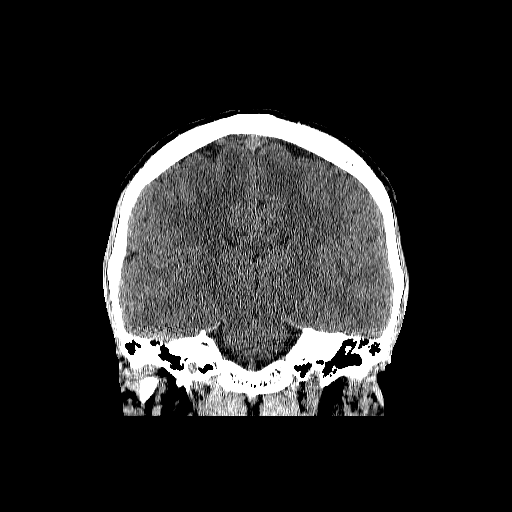
[im 65/103  brain]
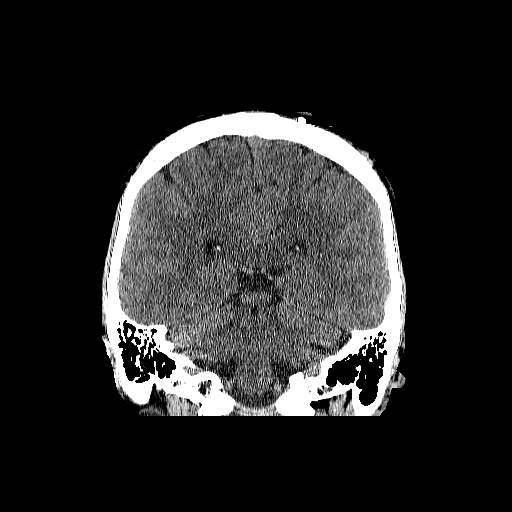
[im 84/103  brain]
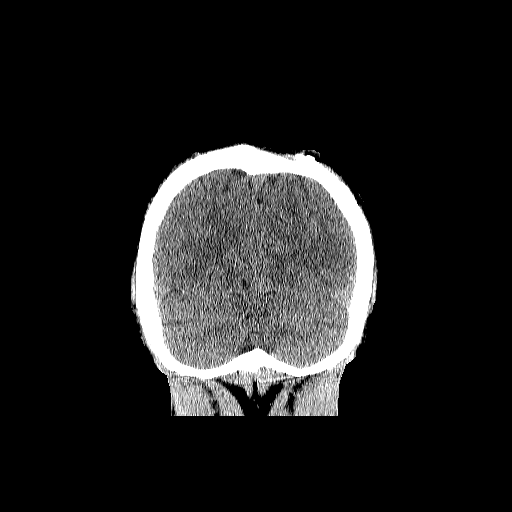
[im 93/103  brain]
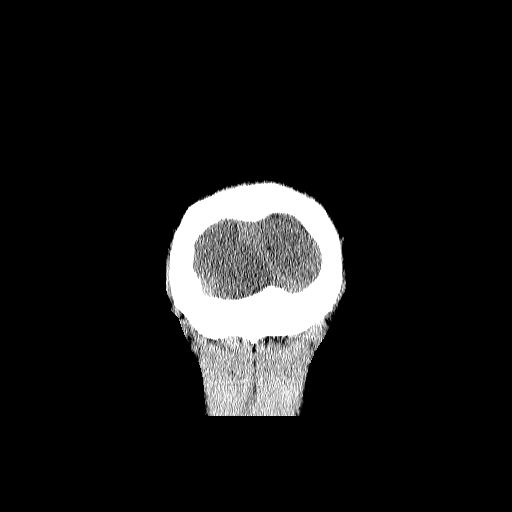

[Series 602: sag brain venogram · sagittal · 0.49mm/px · 7 of 93 slices shown]
[im 10/93  brain]
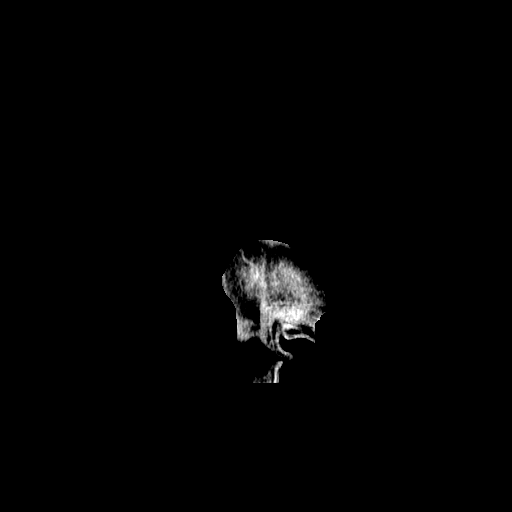
[im 19/93  brain]
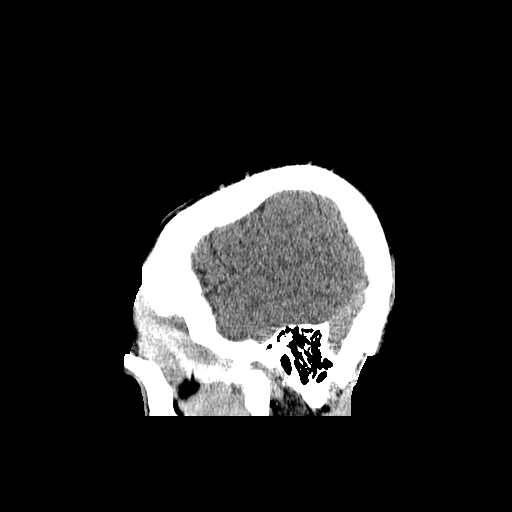
[im 28/93  brain]
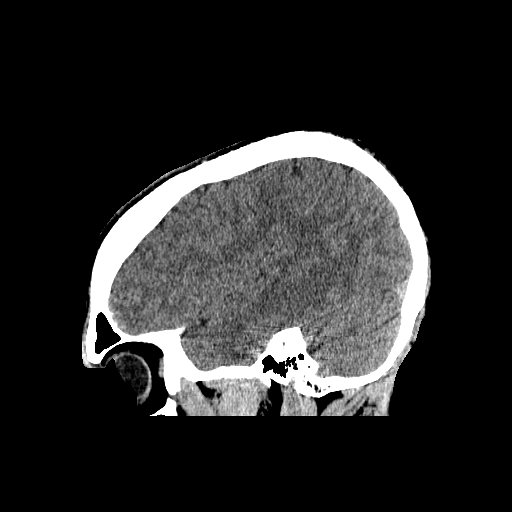
[im 37/93  brain]
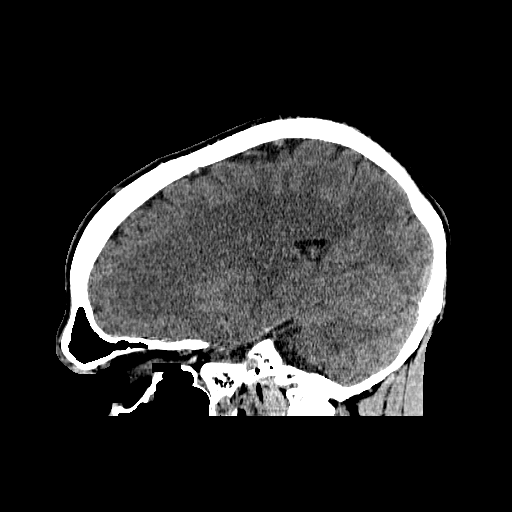
[im 56/93  brain]
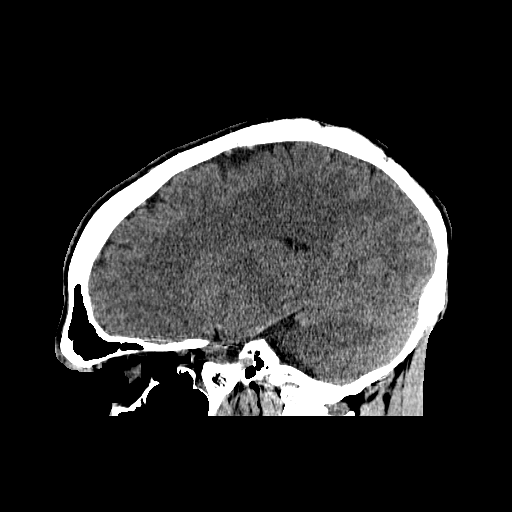
[im 65/93  brain]
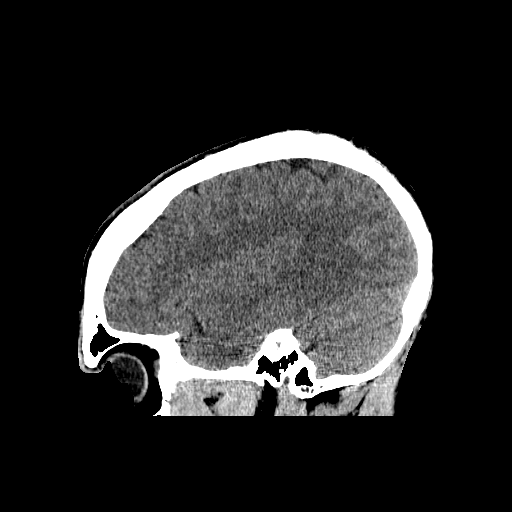
[im 74/93  brain]
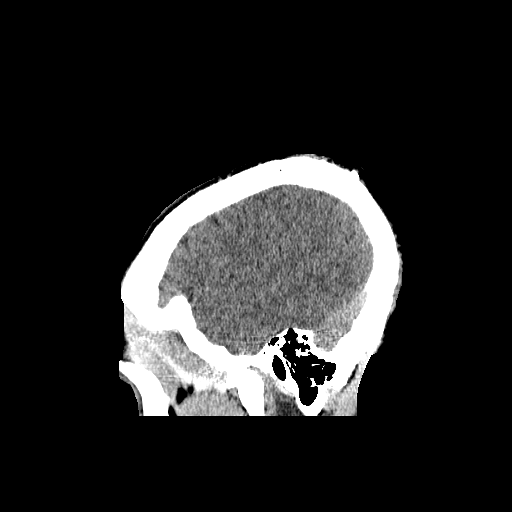

[18 of 30 positions shown; findings below may reference images not displayed]

FINDINGS: Previously noted epidural hematoma has resolved. No acute infarct,
hemorrhage, or mass lesion is present. There is no significant
extra-axial fluid collection. The basal ganglia are intact. The
ventricles are of normal size.

A left parietal craniotomy is noted. The anterior and inferior
margins of the craniotomy flap are solidly fused. The calvarium is
otherwise intact. The paranasal sinuses and mastoid air cells are
clear.
IMPRESSION: 1. Resolution of left-sided epidural hematoma.
2. No residual or recurrent extra-axial collection.
3. Normal CT appearance the brain.
4. Left parietal craniotomy flap appears fused inferiorly and
anteriorly without evidence for motion.
